# Patient Record
Sex: Male | Born: 1980
Health system: Southern US, Community
[De-identification: ages and names within clinical notes are randomized; demographics above are authoritative.]

## PROBLEM LIST (undated history)

## (undated) DIAGNOSIS — S62102A Fracture of unspecified carpal bone, left wrist, initial encounter for closed fracture: Secondary | ICD-10-CM

## (undated) DIAGNOSIS — K219 Gastro-esophageal reflux disease without esophagitis: Secondary | ICD-10-CM

## (undated) DIAGNOSIS — J189 Pneumonia, unspecified organism: Secondary | ICD-10-CM

## (undated) DIAGNOSIS — D649 Anemia, unspecified: Secondary | ICD-10-CM

## (undated) HISTORY — PX: OTHER SURGICAL HISTORY: SHX169

## (undated) HISTORY — PX: NO PAST SURGERIES: SHX2092

---
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
## 2009-02-17 ENCOUNTER — Emergency Department (HOSPITAL_COMMUNITY): Admission: EM | Admit: 2009-02-17 | Discharge: 2009-02-17 | Payer: Self-pay | Admitting: Emergency Medicine

## 2009-02-28 ENCOUNTER — Emergency Department (HOSPITAL_COMMUNITY): Admission: EM | Admit: 2009-02-28 | Discharge: 2009-02-28 | Payer: Self-pay | Admitting: Emergency Medicine

## 2012-02-23 ENCOUNTER — Emergency Department (HOSPITAL_COMMUNITY)
Admission: EM | Admit: 2012-02-23 | Discharge: 2012-02-24 | Disposition: A | Discharge status: Home / Self Care | Payer: Self-pay | Attending: Emergency Medicine | Admitting: Emergency Medicine

## 2012-02-23 ENCOUNTER — Encounter (HOSPITAL_COMMUNITY): Payer: Self-pay | Admitting: *Deleted

## 2012-02-23 DIAGNOSIS — M65839 Other synovitis and tenosynovitis, unspecified forearm: Secondary | ICD-10-CM | POA: Insufficient documentation

## 2012-02-23 DIAGNOSIS — F17200 Nicotine dependence, unspecified, uncomplicated: Secondary | ICD-10-CM | POA: Insufficient documentation

## 2012-02-23 DIAGNOSIS — R209 Unspecified disturbances of skin sensation: Secondary | ICD-10-CM | POA: Insufficient documentation

## 2012-02-23 NOTE — ED Notes (Signed)
The pt is c/o rt hand numbness and tingliong.  He had a piece of metal embedded in his rt little finger and he is afraid that another piece of metal is in his hand

## 2012-02-23 NOTE — ED Notes (Addendum)
Pt states he got metal in his right hand at work 2 days ago and thought he retrieved all remnants. Since incident he has been experiencing numbness and pain in right hand and arm. No swelling, redness or deformity noted. No lac or abrasion noted.

## 2012-02-24 MED ORDER — TETANUS-DIPHTH-ACELL PERTUSSIS 5-2.5-18.5 LF-MCG/0.5 IM SUSP
0.5000 mL | Freq: Once | INTRAMUSCULAR | Status: AC
  Administered 2012-02-24: 0.5 mL via INTRAMUSCULAR
  Filled 2012-02-24: qty 0.5

## 2012-02-24 MED ORDER — KETOROLAC TROMETHAMINE 60 MG/2ML IM SOLN
60.0000 mg | Freq: Once | INTRAMUSCULAR | Status: AC
  Administered 2012-02-24: 60 mg via INTRAMUSCULAR
  Filled 2012-02-24: qty 2

## 2012-02-24 MED ORDER — NAPROXEN 500 MG PO TABS: 500.0000 mg | ORAL_TABLET | Freq: Two times a day (BID) | ORAL | Status: DC

## 2012-02-24 MED ORDER — TRAMADOL HCL 50 MG PO TABS: 50.0000 mg | ORAL_TABLET | Freq: Four times a day (QID) | ORAL | Status: DC | PRN

## 2012-02-24 NOTE — ED Provider Notes (Signed)
Medical screening examination/treatment/procedure(s) were performed by non-physician practitioner and as supervising physician I was immediately available for consultation/collaboration.  Kennedey Digilio L Amori Colomb, MD 02/24/12 0830 

## 2012-02-24 NOTE — ED Provider Notes (Signed)
History     CSN: [id number]  Arrival date & time 02/23/12  2249   First MD Initiated Contact with Patient 02/24/12 0005      Chief Complaint  Patient presents with  . Hand Pain   HPI  History provided by the patient. Patient is a 31 year old male with no significant PMH who presents with complaints of right hand pain, throbbing with associated tingling and numbness. Symptoms have been present for the past one to 2 weeks. Patient works for a [employer] and is right-hand dominant and performs many repetitive actions and movements with his hands. Patient also states that he occasionally is prone to having small pieces of metal hit his hands and fingers while performing grinding or cutting. Patient denies having any specific injury prior to symptoms. He complains of pain primarily located on the palmar surface over the second metacarpal area. He denies having any swelling or redness of the skin. Patient states pain and throbbing causes a tingling and numbness to all of his fingertips. Pain is worse with some movements especially grabbing and holding objects. He denies any significant pain or discomfort in the wrist.    History reviewed. No pertinent past medical history.  History reviewed. No pertinent past surgical history.  No family history on file.  History  Substance Use Topics  . Smoking status: Current Every Day Smoker  . Smokeless tobacco: Not on file  . Alcohol Use: No      Review of Systems  Constitutional: Negative for fever and chills.  Musculoskeletal:       Right hand pain  Neurological: Positive for numbness. Negative for weakness.  All other systems reviewed and are negative.    Allergies  Review of patient's allergies indicates no known allergies.  Home Medications  No current outpatient prescriptions on file.  BP 110/59  Pulse 62  Temp 97.8 F (36.6 C) (Oral)  Resp 18  SpO2 97%  Physical Exam  Nursing note and vitals  reviewed. Constitutional: He is oriented to person, place, and time. He appears well-developed and well-nourished. No distress.  HENT:  Head: Normocephalic.  Cardiovascular: Normal rate and regular rhythm.   Pulmonary/Chest: Effort normal and breath sounds normal.  Musculoskeletal: Normal range of motion. He exhibits tenderness. He exhibits no edema.       Normal range of motion of fingers and wrist of right hand. Normal grip strength. There is some pain with movements and local tenderness over the palmar surface. No gross deformities. No swelling or erythema of the skin. No lesions or abrasions of the skin. Normal distal sensation of the fingers to light and sharp touch. Normal cap refill less than 2 seconds. Normal radial pulses. Negative Tinel's sign and Phalen's sign. Negative Finkelstein test. No pain or tenderness in the forearm. No swelling.  Neurological: He is alert and oriented to person, place, and time.  Skin: Skin is warm. No rash noted. No erythema.  Psychiatric: He has a normal mood and affect. His behavior is normal.    ED Course  Procedures     1. Tendinitis of hand       MDM  12:15 AM patient seen and evaluated. Patient well-appearing in no acute distress. No significant swelling or skin changes of the hand. No lacerations, abrasions or other wounds.  No signs concerning for infection. 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 Symptoms may represent a tendinitis or overuse injury. No signs for carpal tunnel syndrome.  Angus Seller, [place] 02/24/12 [phone]

## 2012-02-24 NOTE — Progress Notes (Signed)
Orthopedic Tech Progress Note Patient Details:  Ashby Kapala Sep 15, 1980 [id number]  Ortho Devices Type of Ortho Device: Velcro wrist splint   Haskell Flirt 02/24/2012, 12:59 AM

## 2013-02-19 ENCOUNTER — Observation Stay (HOSPITAL_COMMUNITY)
Admission: EM | Admit: 2013-02-19 | Discharge: 2013-02-20 | Disposition: A | Discharge status: Home / Self Care | Payer: Self-pay | Attending: Internal Medicine | Admitting: Internal Medicine

## 2013-02-19 ENCOUNTER — Encounter (HOSPITAL_COMMUNITY): Payer: Self-pay | Admitting: Emergency Medicine

## 2013-02-19 DIAGNOSIS — N179 Acute kidney failure, unspecified: Secondary | ICD-10-CM

## 2013-02-19 DIAGNOSIS — Z23 Encounter for immunization: Secondary | ICD-10-CM | POA: Insufficient documentation

## 2013-02-19 DIAGNOSIS — K649 Unspecified hemorrhoids: Secondary | ICD-10-CM

## 2013-02-19 DIAGNOSIS — I498 Other specified cardiac arrhythmias: Secondary | ICD-10-CM | POA: Insufficient documentation

## 2013-02-19 DIAGNOSIS — R1013 Epigastric pain: Secondary | ICD-10-CM

## 2013-02-19 DIAGNOSIS — R5381 Other malaise: Secondary | ICD-10-CM | POA: Insufficient documentation

## 2013-02-19 DIAGNOSIS — K922 Gastrointestinal hemorrhage, unspecified: Secondary | ICD-10-CM

## 2013-02-19 DIAGNOSIS — K6289 Other specified diseases of anus and rectum: Secondary | ICD-10-CM | POA: Insufficient documentation

## 2013-02-19 DIAGNOSIS — K625 Hemorrhage of anus and rectum: Principal | ICD-10-CM

## 2013-02-19 DIAGNOSIS — R109 Unspecified abdominal pain: Secondary | ICD-10-CM | POA: Insufficient documentation

## 2013-02-19 DIAGNOSIS — R42 Dizziness and giddiness: Secondary | ICD-10-CM | POA: Insufficient documentation

## 2013-02-19 DIAGNOSIS — I951 Orthostatic hypotension: Secondary | ICD-10-CM

## 2013-02-19 DIAGNOSIS — N289 Disorder of kidney and ureter, unspecified: Secondary | ICD-10-CM

## 2013-02-19 LAB — CBC WITH DIFFERENTIAL/PLATELET
Basophils Relative: 0 % (ref 0–1)
Eosinophils Absolute: 0.2 10*3/uL (ref 0.0–0.7)
Eosinophils Relative: 3 % (ref 0–5)
Lymphs Abs: 1.9 10*3/uL (ref 0.7–4.0)
MCHC: 33.9 g/dL (ref 30.0–36.0)
MCV: 82.5 fL (ref 78.0–100.0)
Monocytes Absolute: 0.7 10*3/uL (ref 0.1–1.0)
Neutrophils Relative %: 57 % (ref 43–77)
Platelets: 251 10*3/uL (ref 150–400)
RBC: 4.97 MIL/uL (ref 4.22–5.81)

## 2013-02-19 LAB — SODIUM, URINE, RANDOM: Sodium, Ur: 239 mEq/L

## 2013-02-19 LAB — HEPATIC FUNCTION PANEL
ALT: 33 U/L (ref 0–53)
Albumin: 3.6 g/dL (ref 3.5–5.2)
Bilirubin, Direct: <0.1 mg/dL (ref 0.0–0.3)
Total Bilirubin: 0.4 mg/dL (ref 0.3–1.2)

## 2013-02-19 LAB — BASIC METABOLIC PANEL
BUN: 12 mg/dL (ref 6–23)
CO2: 26 mEq/L (ref 19–32)
Calcium: 8.8 mg/dL (ref 8.4–10.5)
Chloride: 103 mEq/L (ref 96–112)
Chloride: 105 mEq/L (ref 96–112)
GFR calc Af Amer: 73 mL/min — ABNORMAL LOW (ref >90)
GFR calc non Af Amer: 61 mL/min — ABNORMAL LOW (ref >90)
Glucose, Bld: 108 mg/dL — ABNORMAL HIGH (ref 70–99)
Glucose, Bld: 90 mg/dL (ref 70–99)
Potassium: 4 mEq/L (ref 3.5–5.1)
Sodium: 139 mEq/L (ref 135–145)

## 2013-02-19 LAB — LIPASE, BLOOD: Lipase: 95 U/L — ABNORMAL HIGH (ref 11–59)

## 2013-02-19 LAB — LACTIC ACID, PLASMA: Lactic Acid, Venous: 1.2 mmol/L (ref 0.5–2.2)

## 2013-02-19 LAB — RAPID URINE DRUG SCREEN, HOSP PERFORMED
Amphetamines: NOT DETECTED
Benzodiazepines: NOT DETECTED

## 2013-02-19 LAB — CREATININE, URINE, RANDOM: Creatinine, Urine: 215.39 mg/dL

## 2013-02-19 LAB — TROPONIN I: Troponin I: <0.3 ng/mL (ref <0.30)

## 2013-02-19 MED ORDER — SODIUM CHLORIDE 0.9 % IJ SOLN: 3.0000 mL | INTRAMUSCULAR | Status: DC | PRN

## 2013-02-19 MED ORDER — SODIUM CHLORIDE 0.9 % IV BOLUS (SEPSIS)
1000.0000 mL | Freq: Once | INTRAVENOUS | Status: AC
  Administered 2013-02-19: 1000 mL via INTRAVENOUS

## 2013-02-19 MED ORDER — MORPHINE SULFATE 4 MG/ML IJ SOLN
4.0000 mg | Freq: Once | INTRAMUSCULAR | Status: AC
  Administered 2013-02-19: 4 mg via INTRAVENOUS
  Filled 2013-02-19: qty 1

## 2013-02-19 MED ORDER — INFLUENZA VAC SPLIT QUAD 0.5 ML IM SUSP
0.5000 mL | INTRAMUSCULAR | Status: AC
  Administered 2013-02-20: 0.5 mL via INTRAMUSCULAR
  Filled 2013-02-19: qty 0.5

## 2013-02-19 MED ORDER — SODIUM CHLORIDE 0.9 % IV SOLN: 250.0000 mL | INTRAVENOUS | Status: DC | PRN

## 2013-02-19 MED ORDER — PNEUMOCOCCAL VAC POLYVALENT 25 MCG/0.5ML IJ INJ
0.5000 mL | INJECTION | INTRAMUSCULAR | Status: AC
  Administered 2013-02-20: 0.5 mL via INTRAMUSCULAR
  Filled 2013-02-19: qty 0.5

## 2013-02-19 MED ORDER — SODIUM CHLORIDE 0.9 % IJ SOLN: 3.0000 mL | Freq: Two times a day (BID) | INTRAMUSCULAR | Status: DC

## 2013-02-19 MED ORDER — SODIUM CHLORIDE 0.9 % IV SOLN
INTRAVENOUS | Status: DC
  Administered 2013-02-19 (×2): via INTRAVENOUS

## 2013-02-19 MED ORDER — ACETAMINOPHEN 325 MG PO TABS
650.0000 mg | ORAL_TABLET | Freq: Four times a day (QID) | ORAL | Status: DC | PRN
  Administered 2013-02-19: 650 mg via ORAL
  Filled 2013-02-19: qty 2

## 2013-02-19 NOTE — ED Provider Notes (Signed)
CSN: [id number]     Arrival date & time 02/19/13  1302 History   First MD Initiated Contact with Patient 02/19/13 1316     Chief Complaint  Patient presents with  . GI Bleeding   (Consider location/radiation/quality/duration/timing/severity/associated sxs/prior Treatment) HPI  A generally healthy 32 year old male presents complaining of rectal bleeding. Patient states he has noticed dark red blood in his stools ongoing for the past 1 week. Report having low abnormal pain and rectal pain with associate GI bleeding. Blood is quantify as the third of a cup, as it filled his tissue paper and dripped to the toilet bowl.  Abdominal pain has been of about 2 days, persistent, nothing makes it better or worse. Pain is located to his low abdomen. No specific treatment tried. Denies having fever, chills, lightheadedness, dizziness, chest pain, shortness of breath, back pain, dysuria, hematuria, melena. Denies taking any NSAIDs on a regular basis. Denies history of alcohol abuse. Does not take any blood thinner medication. Denies any recent trauma. Patient usually has 2-3 bowel movement per day, and that has been normal.  History reviewed. No pertinent past medical history. History reviewed. No pertinent past surgical history. History reviewed. No pertinent family history. History  Substance Use Topics  . Smoking status: Current Every Day Smoker  . Smokeless tobacco: Not on file  . Alcohol Use: No    Review of Systems  All other systems reviewed and are negative.    Allergies  Review of patient's allergies indicates no known allergies.  Home Medications   Current Outpatient Rx  Name  Route  Sig  Dispense  Refill  . naproxen (NAPROSYN) 500 MG tablet   Oral   Take 1 tablet (500 mg total) by mouth 2 (two) times daily.   30 tablet   0   . traMADol (ULTRAM) 50 MG tablet   Oral   Take 1 tablet (50 mg total) by mouth every 6 (six) hours as needed for pain.   15 tablet   0    BP 113/64   Pulse 67  Temp(Src) 98.1 F (36.7 C) (Oral)  Resp 16  Wt 288 lb 3.2 oz (130.727 kg)  SpO2 97% Physical Exam  Nursing note and vitals reviewed. Constitutional: He is oriented to person, place, and time.  Awake, alert, nontoxic appearance  HENT:  Head: Atraumatic.  Mouth/Throat: Oropharynx is clear and moist.  Eyes: Right eye exhibits no discharge. Left eye exhibits no discharge.  Neck: Neck supple.  Pulmonary/Chest: Effort normal. He exhibits no tenderness.  Abdominal: Soft. Bowel sounds are normal. There is tenderness (Suprapubic and left lower quadrant tenderness on palpation without guarding or rebound tenderness. No hernia noted.). There is no rebound and no guarding.  Genitourinary:  Difficult to perform digital rectal exam as it causes great discomfort to patient.  external hemorrhoid noted without evidence of thrombosis. Bright red blood per rectum without obvious anal fissure or mass. Unable to assess stool color.  Musculoskeletal: He exhibits no tenderness.  Baseline ROM, no obvious new focal weakness  Neurological: He is alert and oriented to person, place, and time.  Mental status and motor strength appears baseline for patient and situation  Skin: No rash noted.  Psychiatric: He has a normal mood and affect.    ED Course  Procedures (including critical care time)  Pt with BRBPR, new onset for the past 1 week.  Has lower abdominal pain as well.  DDx includes colitis, diverticulitis, hemorrhoids, IBD, anal fissure, neoplasm, AVM, ischemic bowel disease.  Work up initiated. Care discussed with attending.    2:47 PM BRBPR likely 2/2 to hemorrhoids.  Pt is in no acute distress, abdomen mildly tender without peritoneal sign.  Pt is hemodynamically stable.  Low suspicion for colitis, diverticulitis, IBD or neoplasm.   3:40 PM Pt has low BP with systolic in the 90s, and elevated HR from 48 to 80 on orthostatic VS .  Continues to endorse low abdominal pain, not improved with  morphine.  Does have some evidence of renal insufficiency with Cr. 1.45 without baseline for comparison.  With recurrent rectal bleeding and pt being symptomatic, plan to have pt admitted for obs for further care.    4:19 PM I have consulted with teaching service resident, who agrees to see pt in ER and admit for obs.  Labs Review Labs Reviewed  BASIC METABOLIC PANEL - Abnormal; Notable for the following:    Glucose, Bld 108 (*)    Creatinine, Ser 1.45 (*)    GFR calc non Af Amer 63 (*)    GFR calc Af Amer 73 (*)    All other components within normal limits  CBC WITH DIFFERENTIAL  HEPATIC FUNCTION PANEL   Imaging Review No results found.  EKG Interpretation   None       MDM   1. BRBPR (bright red blood per rectum)   2. Renal insufficiency    BP 99/62  Pulse 80  Temp(Src) 98.1 F (36.7 C) (Oral)  Resp 16  Wt 288 lb 3.2 oz (130.727 kg)  SpO2 93%  I have reviewed nursing notes and vital signs.  I reviewed available ER/hospitalization records thought the EMR     Fayrene Helper, [place] 02/19/13 1620

## 2013-02-19 NOTE — ED Notes (Signed)
PA at bedside, made aware of pt blood pressures. Order for morphine medication verified with PA.

## 2013-02-19 NOTE — H&P (Signed)
Date: 02/19/2013               Patient Name:  Dylan Brooks MRN: [id number]  DOB: 1980-04-29 Age / Sex: 32 y.o., male   PCP: Provider Default, MD         Medical Service: Internal Medicine Teaching Service         Attending Physician: Dr. Kem Kays    First Contact: Dr. Yetta Barre Pager: [phone]  Second Contact: Dr. Shirlee Latch Pager: [phone]       After Hours (After 5p/  First Contact Pager: [phone]  weekends / holidays): Second Contact Pager: [phone]   Chief Complaint: BRBPR, rectal pain, abdominal pain.   History of Present Illness:  [name] is a 32 y.o. y/o male w/ no known PMHx presents to the ED w/ complaints of bright red blood per rectum and abdominal pain for the last 3-4 days. Patient claims that he has about 3 bowel movements a day, soft, and w/ blood mixed in with the stool, in the toilet bowl, and on the toilet paper when he wipes. He says he has never had this problem before. The patient also admits to rectal pain with bowel movements, epigastric pain, lightheadedness when standing, and recent fatigue. On arrival to the ED, patient was found to have orthostatic vitals w/ change in pulse from 50 lying to 80 standing. The patient was given 1L bolus of NS and orthostatics were repeated, showing orthostatic change in pulse from 49 lying to 78 sitting.  BMET was also drawn in ED, shown to have Cr of 1.45. After fluid bolus, was still 1.49.  Otherwise, the patient denies any further issues. No recent fever or chills, no nausea, vomiting, hematemesis, constipation, diarrhea, or sick contacts. No SOB, or chest pain. He claims he has not eaten anything strange recently that made him feel sick and no one else in his family has had similar issues. No dysuria, hematuria, urgency or increased frequency of urination.  The patient did say that his mother has some type of cancer, but is unsure of the type, but does not suspect it is colon cancer.   Meds: Current Facility-Administered  Medications  Medication Dose Route Frequency Provider Last Rate Last Dose  . 0.9 %  sodium chloride infusion   Intravenous Continuous Fayrene Helper, PA-C 125 mL/hr at 02/19/13 1719     No current outpatient prescriptions on file.   Allergies: Allergies as of 02/19/2013  . (No Known Allergies)   No surgical history  No past medical history Mother has history of cancer; type unknown.  History   Social History  . Marital Status: Married    Spouse Name: N/A    Number of Children: N/A  . Years of Education: N/A   Occupational History  . Not on file.   Social History Main Topics  . Smoking status: Current Every Day Smoker  . Smokeless tobacco: Not on file  . Alcohol Use: No  . Drug Use:   . Sexual Activity:    Other Topics Concern  . Not on file   Social History Narrative  . No narrative on file    Review of Systems: General: Positive for fatigue. Denies fever, chills, diaphoresis, appetite change.  Respiratory: Denies SOB, DOE, cough, chest tightness, and wheezing.   Cardiovascular: Denies chest pain, palpitations and leg swelling.  Gastrointestinal: Positive for blood in stools, abdominal pain, rectal pain w/ BM. Denies nausea, vomiting, diarrhea, constipation,  and abdominal distention.  Genitourinary: Denies dysuria, urgency, frequency,  hematuria, flank pain and difficulty urinating.  Musculoskeletal: Denies myalgias, back pain, joint swelling, arthralgias and gait problem.  Skin: Denies pallor, rash and wounds.  Neurological: Positive for weakness, and mild lightheadness on standing. Denies dizziness, seizures, syncope,  numbness and headaches.  Psychiatric/Behavioral: Denies mood changes, confusion, nervousness, sleep disturbance and agitation.  Physical Exam: Filed Vitals:   02/19/13 1650 02/19/13 1652 02/19/13 1653 02/19/13 1715  BP: 103/62 107/60 102/58 116/54  Pulse: 47 78 71 54  Temp:      TempSrc:      Resp:  16 16   Weight:      SpO2: 96% 97% 96% 96%    General: Vital signs reviewed.  Patient is a well-developed and well-nourished, in no acute distress and cooperative with exam. Alert and oriented x3.  Head: Normocephalic and atraumatic. Eyes: PERRL, EOMI, conjunctivae normal, No scleral icterus.  Neck: Supple, trachea midline, normal ROM, No JVD, masses, thyromegaly, or carotid bruit present.  Cardiovascular: RRR, S1 normal, S2 normal, no murmurs, gallops, or rubs. Pulmonary/Chest: Normal respiratory effort, CTAB, no wheezes, rales, or rhonchi. Abdominal: Soft, mildly tender in the epigastrium, non-distended, bowel sounds are normal, no masses, organomegaly, or guarding present. Tenderness on rectal exam. Internal and external hemorrhoids present.  Musculoskeletal: No joint deformities, erythema, or stiffness, ROM full and no nontender. Extremities: No swelling or edema,  pulses symmetric and intact bilaterally. No cyanosis or clubbing. Neurological: A&O x3, Strength is normal and symmetric bilaterally, cranial nerve II-XII are grossly intact, no focal motor deficit, sensory intact to light touch bilaterally.  Skin: Warm, dry and intact. No rashes or erythema. Multiple tattoos, face, abdomen.  Psychiatric: Normal mood and affect. speech and behavior is normal. Cognition and memory are normal.   Lab results: Basic Metabolic Panel:  Recent Labs  78/29/56 1424 02/19/13 1648  NA 139 139  K 3.7 4.0  CL 103 105  CO2 26 26  GLUCOSE 108* 90  BUN 12 12  CREATININE 1.45* 1.49*  CALCIUM 9.2 8.8   Liver Function Tests:  Recent Labs  02/19/13 1424  AST 27  ALT 33  ALKPHOS 49  BILITOT 0.4  PROT 6.8  ALBUMIN 3.6   CBC:  Recent Labs  02/19/13 1424  WBC 6.4  NEUTROABS 3.7  HGB 13.9  HCT 41.0  MCV 82.5  PLT 251   Other results: EKG: Sinus @ 48 bpm, w/ possible diffuse ST elevation, suggestive of pericarditis  Assessment & Plan by Problem: [name] is a 32 y.o. y/o male w/ no known PMHx presents to the ED w/  complaints of bright red blood per rectum and abdominal pain for the last 3-4 days. Shown to be orthostatic in the ED, even after 1L bolus. EKG also showed possible diffuse ST elevation, suggestive of pericarditis.  BRBPR- Claims he has had these symptoms for the past 3-4 days, w/ associated epigastric pain, rectal pain, lightheadedness, and fatigue. He says he has never has this issue before and has blood in his stool with every bowel movement lately, and claims the amount to be quite significant. The patient claims he has not felt like himself recently. On admission, found to have normal Hb of 13.9, however, his baseline is unknown. He also admits to decreased po intake recently as he has not felt well lately. In the ED, was found to have orthostatic vitals w/ pulse change of 50 lying to 80 standing. He was given 1L bolus of fluids and orthostatics vitals still showed significant change in  pulse (>20). At this point, findings are most likely 2/2 hemorrhoidal bleed as he describes rectal pain as well. ON rectal exam, patient was noted to have small (1 cm) external hemorrhoid as well as internal hemorrhoids. Tenderness was noted on examination. Other possibilities include diverticulosis, however this is less likely given the patient's age. Colitis is also a possibility, but abdominal pain on palpation is very mild in nature. He also describes a family history of cancer in his mother (87 y/o), but does not admit to recent weight loss, night sweats, fever, chills, or severe fatigue (only mild fatigue). The patient also describes epigastric pain, which may be due to gastritis, however, this would not explain his BRBPR at this time. Must laso rule out bowel ischemia as well, however, given normal HCO3 and absence of anion gap, this is very unlikely. Will also consider pancreatitis. -Admit to telemtry for observation (see below).  -Will give another 1L NS bolus.  -Repeat CBC + PT/INR in AM.  -Consider colonoscopy  as an outpatient given unknown family history of cancer.  -HIV pending -Clear liquid diet for now.  -EKG pending -Lactic acid, lipase pending  AKI- Patient was also noted to have Cr of 1.45 on admission. After 1L bolus, BMET was repeated, found to be 1.49. Baseline for this patient is unknown. AKI probably d/t pre-renal etiology as he describes decreased po intake lately and likely is volume depleted at this time given his +ve orthostatic changes. Denies any recent antibiotic use, no recent contrast imaging, and claims to only have NSAIDS very infrequently, but does claim he had 1-2 in the past couple of days. UDS +ve for opiates and cocaine, possibly contributing to AKI.  Denies any urinary symptoms, therefore, post-renal etiology is very unlikely.  -Collect Urine creatinine and sodium to calculate FeNa. -Continue IVF for now, giving another 1L bolus at this time. Will then start NS @ 125 ml/hr.  -Repeat BMET in the AM.  -UDS +ve for opiates, cocaine, and THC. Blood alcohol pending. -UA pending to look for granular casts, protein, crystals, ketones.   Possible Pericarditis- Patient w/ no complaints of chest pain, but epigastric pain. EKG performed in the ED, showed possible diffuse ST elevation, suggestive of pericarditis. Patient denies recent URI symptoms, pain w/ lying on back, and no recent SOB. No tachycardia on exam, no pericardial friction rub, no distant heart sounds, no JVD. It is possible that patient has viral infection that could explain these findings, as well as GI findings and AKI. Given +ve UDS for cocaine, this could also be responsible for findings as well. -Spoke w/ Dr. Tresa Endo, cardiologist, felt that EKG changes were likely J-point elevation in a young African-American male. However, felt that ECHO in the AM would be reasonable test. Does not feel that changes are significant for ACS.  -Repeat EKG in AM -Troponin x1 pending -2v CXR pending -Consider ECHO in AM. -Will withhold  treatment at this time as NSAIDS could make other issue worse at this time.   DVT/PE PPx: SCD's  Dispo: Disposition is deferred at this time, awaiting improvement of current medical problems. Anticipated discharge in approximately 1-2 day(s).   The patient does not have a current PCP (Provider Default, MD) and does need an [facility] hospital follow-up appointment after discharge.  The patient does not have transportation limitations that hinder transportation to clinic appointments.  Signed: Courtney Paris, MD 02/19/2013, 5:37 PM  Pager: [phone]

## 2013-02-19 NOTE — ED Notes (Signed)
NOTIFIED PHYSICIAN OF LAB RESULTS OF OCCULT BLOOD STOOL = ( + ) POSITIVE, @15 :30PM, 02/19/2013.

## 2013-02-19 NOTE — ED Notes (Signed)
Reports dark red blood in stools x 1 week. Reports generalized abd pain, no vomiting.

## 2013-02-19 NOTE — Progress Notes (Signed)
Received report from [place], Charity fundraiser.  Kathlene November, [place]

## 2013-02-19 NOTE — ED Notes (Signed)
Paged admitting MD to notify EKG has been completed. Dr. Dierdre Searles made aware pf EKG and for her to review in EPIC.

## 2013-02-20 ENCOUNTER — Ambulatory Visit: Payer: Self-pay | Admitting: Internal Medicine

## 2013-02-20 DIAGNOSIS — N179 Acute kidney failure, unspecified: Secondary | ICD-10-CM

## 2013-02-20 DIAGNOSIS — K649 Unspecified hemorrhoids: Secondary | ICD-10-CM | POA: Diagnosis present

## 2013-02-20 DIAGNOSIS — F1910 Other psychoactive substance abuse, uncomplicated: Secondary | ICD-10-CM

## 2013-02-20 DIAGNOSIS — K625 Hemorrhage of anus and rectum: Secondary | ICD-10-CM | POA: Diagnosis present

## 2013-02-20 DIAGNOSIS — R1013 Epigastric pain: Secondary | ICD-10-CM | POA: Diagnosis present

## 2013-02-20 LAB — RPR: RPR Ser Ql: NONREACTIVE

## 2013-02-20 LAB — BASIC METABOLIC PANEL
CO2: 26 mEq/L (ref 19–32)
Calcium: 8.7 mg/dL (ref 8.4–10.5)
Glucose, Bld: 90 mg/dL (ref 70–99)
Potassium: 3.6 mEq/L (ref 3.5–5.1)
Sodium: 138 mEq/L (ref 135–145)

## 2013-02-20 LAB — CBC
HCT: 39.1 % (ref 39.0–52.0)
Hemoglobin: 13.1 g/dL (ref 13.0–17.0)
MCH: 27.8 pg (ref 26.0–34.0)
MCV: 82.8 fL (ref 78.0–100.0)
RBC: 4.72 MIL/uL (ref 4.22–5.81)
WBC: 8.6 10*3/uL (ref 4.0–10.5)

## 2013-02-20 LAB — TSH: TSH: 2.217 u[IU]/mL (ref 0.350–4.500)

## 2013-02-20 LAB — TROPONIN I: Troponin I: <0.3 ng/mL (ref <0.30)

## 2013-02-20 MED ORDER — NICOTINE 21 MG/24HR TD PT24
21.0000 mg | MEDICATED_PATCH | Freq: Every day | TRANSDERMAL | Status: DC
  Administered 2013-02-20: 21 mg via TRANSDERMAL
  Filled 2013-02-20: qty 1

## 2013-02-20 NOTE — Discharge Summary (Signed)
Name: Dylan Brooks MRN: [id number] DOB: [birth year] 32 y.o. PCP: Jeanann Lewandowsky, MD  Date of Admission: 02/19/2013  1:13 PM Date of Discharge: 02/20/13 Attending Physician: Kem Kays  Discharge Diagnosis: 1. BRBPR 2. AKI 3. Possible Pericarditis 4. Substance abuse  Discharge Medications:   Medication List    Notice   You have not been prescribed any medications.      Disposition and follow-up:   Mr.Dylan Brooks was discharged from [facility] in good condition.  At the hospital follow up visit please address:  1.  Any further BRBPR? Dizziness, lightheadedness, fatigue, rectal pain? Chest pain?  2.  Labs / imaging needed at time of follow-up: BMET, CBC  3.  Pending labs/ test needing follow-up: none  Follow-up Appointments: Follow-up Information   Follow up with [facility]     On 02/25/2013. (3:15 PM)    Contact information:   [address] [place] [address] [phone]      Discharge Instructions: Discharge Orders   Future Appointments Provider Department Dept Phone   04/27/2013 9:00 AM [facility] [phone]   04/30/2013 10:00 AM Doris Cheadle, MD [facility] [phone]   Future Orders Complete By Expires   Call MD for:  persistant dizziness or light-headedness  As directed    Call MD for:  persistant nausea and vomiting  As directed      Admission HPI:  [name] is a 32 y.o. y/o male w/ no known PMHx presents to the ED w/ complaints of bright red blood per rectum and abdominal pain for the last 3-4 days. Patient claims that he has about 3 bowel movements a day, soft, and w/ blood mixed in with the stool, in the toilet bowl, and on the toilet paper when he wipes. He says he has never had this problem before. The patient also admits to rectal pain with bowel movements, epigastric pain, lightheadedness when standing, and  recent fatigue. On arrival to the ED, patient was found to have orthostatic vitals w/ change in pulse from 50 lying to 80 standing. The patient was given 1L bolus of NS and orthostatics were repeated, showing orthostatic change in pulse from 49 lying to 78 sitting.  BMET was also drawn in ED, shown to have Cr of 1.45. After fluid bolus, was still 1.49.  Otherwise, the patient denies any further issues. No recent fever or chills, no nausea, vomiting, hematemesis, constipation, diarrhea, or sick contacts. No SOB, or chest pain. He claims he has not eaten anything strange recently that made him feel sick and no one else in his family has had similar issues. No dysuria, hematuria, urgency or increased frequency of urination.  The patient did say that his mother has some type of cancer, but is unsure of the type, but does not suspect it is colon cancer.   Hospital Course by problem list:   1. BRBPR- Claims he has had these symptoms for the past 3-4 days, w/ associated epigastric pain, rectal pain, lightheadedness, and fatigue. He says he has never has this issue before and has blood in his stool with every bowel movement lately, and claims the amount to be significant. On admission, found to have normal Hb of 13.9, however, his baseline is unknown. In the ED, was found to have orthostatic vitals w/ pulse change of 50 lying to 80 standing. He was given 1L bolus of fluids and orthostatics vitals  still showed significant change in pulse (>20). Findings most likely 2/2 hemorrhoidal bleed as he describes rectal pain as well. On rectal exam, patient was noted to have small (1 cm) external hemorrhoid as well as internal hemorrhoids. Tenderness was noted on examination. Given another 1L bolus. Lactic acid, lipase found to be normal. UDS +ve for cocaine, opiates, and THC. HIV -ve. CBC trend as follows:  Recent Labs Lab 02/19/13 1424 02/20/13 0100  HGB 13.9 13.1  HCT 41.0 39.1  WBC 6.4 8.6  PLT 251 245    AKI-  Patient was also noted to have Cr of 1.45 on admission. After 1L bolus, BMET was repeated, found to be 1.49. Baseline for this patient is unknown. AKI probably d/t pre-renal etiology as he describes decreased po intake lately and likely is volume depleted at this time given his +ve orthostatic changes. Denies any recent antibiotic use, no recent contrast imaging, and claims to only have NSAIDS very infrequently, but does claim he had 1-2 in the past couple of days. UDS +ve for opiates and cocaine, possibly contributing to AKI. Continued IVF. Repeat Cr as follows:  Recent Labs Lab 02/19/13 1424 02/19/13 1648 02/20/13 0100  CREATININE 1.45* 1.49* 1.48*  Likely that patient has a baseline of 1.4-1.5, given a possible history of substance abuse as well as high creatinine due to larger muscle mass.  Possible Pericarditis- Patient w/ no complaints of chest pain, but epigastric pain. EKG performed in the ED, showed possible diffuse ST elevation, suggestive of pericarditis. Patient denies recent URI symptoms, pain w/ lying on back, and no recent SOB. No tachycardia on exam, no pericardial friction rub, no distant heart sounds, no JVD. Spoke w/ Dr. Tresa Endo, cardiologist, felt that EKG changes were likely J-point elevation in a young African-American male. Troponin x1 -ve, EKG repeated in AM, no change from previous EKG. No further workup.  Substance abuse- Patient asked about drug and alcohol abuse in ED, patient denied. UDS +ve for THC, cocaine, and opiates. Patient continued to deny, social work consulted.  Discharge Vitals:   BP 110/77  Pulse 81  Temp(Src) 98.1 F (36.7 C) (Oral)  Resp 23  Ht 5\' 11"  (1.803 m)  Wt 283 lb 1.6 oz (128.413 kg)  BMI 39.50 kg/m2  SpO2 99%  Discharge Labs:  No results found for this or any previous visit (from the past 24 hour(s)).  Signed: Courtney Paris, MD 02/25/2013, 6:59 PM   Time Spent on Discharge: 35 minutes Services Ordered on Discharge: none Equipment  Ordered on Discharge: none

## 2013-02-20 NOTE — Progress Notes (Signed)
Subjective: [name] is a 32 y.o. y/o male w/ no known PMHx presents to the ED w/ complaints of bright red blood per rectum and abdominal pain for the last 3-4 days. Shown to be orthostatic in the ED, even after 1L bolus.   Patient seen at bedside this AM. Says he feels fine, is ready to leave. Denies any recent abdominal pain, N/V. Still had minimal blood per rectum today.  Objective: Vital signs in last 24 hours: Filed Vitals:   02/20/13 0600 02/20/13 0650 02/20/13 0653 02/20/13 0656  BP: 107/51 107/51 106/71 129/81  Pulse: 43 43 47 73  Temp:      TempSrc:      Resp:      Height:      Weight:      SpO2:       Weight change:   Intake/Output Summary (Last 24 hours) at 02/20/13 0830 Last data filed at 02/20/13 0600  Gross per 24 hour  Intake      0 ml  Output    625 ml  Net   -625 ml   Physical Exam: General: Alert, cooperative, and in no apparent distress HEENT: Vision grossly intact, oropharynx clear and non-erythematous  Neck: Full range of motion without pain, supple, no lymphadenopathy or carotid bruits Lungs: Clear to ascultation bilaterally, normal work of respiration, no wheezes, rales, ronchi Heart: Regular rate and rhythm, no murmurs, gallops, or rubs Abdomen: Soft, non-tender, non-distended, normal bowel sounds Extremities: No cyanosis, clubbing, or edema Neurologic: Alert & oriented X3, cranial nerves II-XII intact, strength grossly intact, sensation intact to light touch  Lab Results: Basic Metabolic Panel:  Recent Labs Lab 02/19/13 1648 02/19/13 1958 02/20/13 0100  NA 139  --  138  K 4.0  --  3.6  CL 105  --  105  CO2 26  --  26  GLUCOSE 90  --  90  BUN 12  --  12  CREATININE 1.49*  --  1.48*  CALCIUM 8.8  --  8.7  MG  --  1.9  --    Liver Function Tests:  Recent Labs Lab 02/19/13 1424  AST 27  ALT 33  ALKPHOS 49  BILITOT 0.4  PROT 6.8  ALBUMIN 3.6    Recent Labs Lab 02/19/13 1958  LIPASE 95*   No results found for  this basename: AMMONIA,  in the last 168 hours CBC:  Recent Labs Lab 02/19/13 1424 02/20/13 0100  WBC 6.4 8.6  NEUTROABS 3.7  --   HGB 13.9 13.1  HCT 41.0 39.1  MCV 82.5 82.8  PLT 251 245   Cardiac Enzymes:  Recent Labs Lab 02/19/13 1900 02/20/13 0100  TROPONINI <0.30 <0.30   Thyroid Function Tests:  Recent Labs Lab 02/19/13 1958  TSH 2.217   Coagulation:  Recent Labs Lab 02/19/13 1958  LABPROT 12.4  INR 0.94   Urine Drug Screen: Drugs of Abuse     Component Value Date/Time   LABOPIA POSITIVE* 02/19/2013 1752   COCAINSCRNUR POSITIVE* 02/19/2013 1752   LABBENZ NONE DETECTED 02/19/2013 1752   AMPHETMU NONE DETECTED 02/19/2013 1752   THCU POSITIVE* 02/19/2013 1752   LABBARB NONE DETECTED 02/19/2013 1752    Alcohol Level:  Recent Labs Lab 02/19/13 1958  ETH <11   Medications: I have reviewed the patient's current medications. Scheduled Meds: . influenza vac split quadrivalent PF  0.5 mL Intramuscular Tomorrow-1000  . pneumococcal 23 valent vaccine  0.5 mL Intramuscular Tomorrow-1000  . sodium chloride  3 mL Intravenous Q12H   Continuous Infusions: . sodium chloride 125 mL/hr at 02/19/13 2143   PRN Meds:.sodium chloride, acetaminophen, sodium chloride Assessment/Plan: [name] is a 32 y.o. y/o male w/ no known PMHx presents to the ED w/ complaints of bright red blood per rectum and abdominal pain for the last 3-4 days. Shown to be orthostatic in the ED, even after 1L bolus. EKG also showed possible diffuse ST elevation, suggestive of pericarditis.  BRBPR- Given history and presentation, likely 2/2 hemorrhoidal bleed at this time.  -Repeat Orthostatics negative this AM. -Continued on NS @ 125 ml/hr -CBC in AM showed Hb of 13.1. -Consider colonoscopy as an outpatient given unknown family history of cancer.  -HIV -ve -Lactic acid wnl, lipase slightly elevated to 95.  AKI- Patient was also noted to have Cr of 1.45 on admission. After 1L  bolus, BMET was repeated, found to be 1.49. Baseline unknown; may have baseline around 1.5 given muscular nature of patient, drug use, alcohol abuse, etc. FeNa 1.19% -Continue NS @ 125 ml/hr.  -Repeat BMET shows Cr of 1.48. -UDS +ve for opiates, cocaine, and THC. Blood alcohol -ve  Possible Pericarditis- Patient w/ no complaints of chest pain. EKG performed in the ED, showed possible diffuse ST elevation, suggestive of pericarditis. Repeat this AM shows same appearance. Patient denies recent URI symptoms, pain w/ lying on back, and no recent SOB. No tachycardia on exam, no pericardial friction rub, no distant heart sounds, no JVD. -Spoke w/ Dr. Tresa Endo, cardiologist, felt that EKG changes were likely J-point elevation in a young African-American male.   Dispo: Disposition is deferred at this time, awaiting improvement of current medical problems.  Anticipated discharge today.   The patient does not have a current PCP (Provider Default, MD) and does need an [facility] hospital follow-up appointment after discharge.  The patient does not have transportation limitations that hinder transportation to clinic appointments.  .Services Needed at time of discharge: Y = Yes, Blank = No PT:   OT:   RN:   Equipment:   Other:     LOS: 1 day   Courtney Paris, MD 02/20/2013, 8:30 AM Pager: [phone]

## 2013-02-20 NOTE — H&P (Signed)
INTERNAL MEDICINE TEACHING SERVICE Attending Admission Note  Date: 02/20/2013  Patient name: Dylan Brooks  Medical record number: [id number]  Date of birth: [birth year]    I have seen and evaluated [facility] and discussed their care with the Residency Team.  32 yr old male with no pmhx presented due to abdominal pani and intermittent BRBPR over past few days. He states he was recently constipated in past week. He states he had one very painful BM last week and since then has had some rectal pain with bleeding noted in stool as well as when wiping.   In the ED, he was found to have orthostatic hypotension and received NS bolus of 1 L. His Hgb/Hct has been stable since admission. He has not had any further bleeding. He denies any abdominal pain today. On exam: CV: S1S2, bradycardic, no m/r/g. PULM: CTA bilat ABD/GI: Soft, NT, +BS, no guarding.  Noted Cr of 1.45 on admission. I don't think he has AKI. He needs to be evaluated as outpatient for CKD. He will require 24 hour urine collection for calculation of CrCl in addition to a renal U/S and screening for HIV, Hep B, Hep C. He will need a UA. Of note, he denied drug use but urine is + for cocaine, THC, and +opiates.  At this time, he is medically stable for D/C. He is no longer orthostatic. I would have him follow up and establish with a PCP in 1-2 weeks. He agrees to this plan.  Jonah Blue, DO, FACP Faculty [facility] Internal Medicine Residency Program 02/20/2013, 1:07 PM

## 2013-02-20 NOTE — Progress Notes (Signed)
Pt discharged to home after visit summary reviewed and pt capable of re verbalizing medications and follow up appointments. Pt remains stable. No signs and symptoms of distress. Educated to return to ER in the event of SOB, dizziness, chest pain, or fainting. Tolbert Matheson, RN   

## 2013-02-20 NOTE — Progress Notes (Signed)
While ambulating pt's o2 sats were 99% on room air. Denied any sob or discomfort. Will f/u.

## 2013-02-20 NOTE — ED Provider Notes (Signed)
Medical screening examination/treatment/procedure(s) were conducted as a shared visit with non-physician practitioner(s) and myself.  I personally evaluated the patient during the encounter.  EKG Interpretation    Date/Time:    Ventricular Rate:    PR Interval:    QRS Duration:   QT Interval:    QTC Calculation:   R Axis:     Text Interpretation:              Pt c/o few episodes rectal bleeding. No hx same. No rectal trauma. No other abn bleeding or bruising.  abd soft nt. Orthostatic vitals.  Labs. Admit.   Suzi Roots, MD 02/20/13 [phone]

## 2013-02-25 ENCOUNTER — Ambulatory Visit: Payer: Self-pay | Attending: Internal Medicine | Admitting: Internal Medicine

## 2013-02-25 ENCOUNTER — Encounter: Payer: Self-pay | Admitting: Internal Medicine

## 2013-02-25 VITALS — BP 116/66 | HR 61 | Temp 99.1°F | Resp 16 | Ht 70.0 in | Wt 280.0 lb

## 2013-02-25 DIAGNOSIS — K219 Gastro-esophageal reflux disease without esophagitis: Secondary | ICD-10-CM | POA: Insufficient documentation

## 2013-02-25 DIAGNOSIS — F17200 Nicotine dependence, unspecified, uncomplicated: Secondary | ICD-10-CM

## 2013-02-25 DIAGNOSIS — Z87898 Personal history of other specified conditions: Secondary | ICD-10-CM

## 2013-02-25 DIAGNOSIS — K649 Unspecified hemorrhoids: Secondary | ICD-10-CM | POA: Insufficient documentation

## 2013-02-25 DIAGNOSIS — Z8659 Personal history of other mental and behavioral disorders: Secondary | ICD-10-CM

## 2013-02-25 DIAGNOSIS — N289 Disorder of kidney and ureter, unspecified: Secondary | ICD-10-CM

## 2013-02-25 DIAGNOSIS — Z139 Encounter for screening, unspecified: Secondary | ICD-10-CM

## 2013-02-25 MED ORDER — NICOTINE 21 MG/24HR TD PT24: 21.0000 mg | MEDICATED_PATCH | Freq: Every day | TRANSDERMAL | Status: DC

## 2013-02-25 MED ORDER — HYDROCORTISONE ACETATE 25 MG RE SUPP: 25.0000 mg | Freq: Two times a day (BID) | RECTAL | Status: DC

## 2013-02-25 MED ORDER — OMEPRAZOLE 20 MG PO CPDR: 20.0000 mg | DELAYED_RELEASE_CAPSULE | Freq: Every day | ORAL | Status: DC

## 2013-02-25 NOTE — Patient Instructions (Signed)
Hemorrhoids Hemorrhoids are swollen veins around the rectum or anus. There are two types of hemorrhoids:   Internal hemorrhoids. These occur in the veins just inside the rectum. They may poke through to the outside and become irritated and painful.  External hemorrhoids. These occur in the veins outside the anus and can be felt as a painful swelling or hard lump near the anus. CAUSES  Pregnancy.   Obesity.   Constipation or diarrhea.   Straining to have a bowel movement.   Sitting for long periods on the toilet.  Heavy lifting or other activity that caused you to strain.  Anal intercourse. SYMPTOMS   Pain.   Anal itching or irritation.   Rectal bleeding.   Fecal leakage.   Anal swelling.   One or more lumps around the anus.  DIAGNOSIS  Your caregiver may be able to diagnose hemorrhoids by visual examination. Other examinations or tests that may be performed include:   Examination of the rectal area with a gloved hand (digital rectal exam).   Examination of anal canal using a small tube (scope).   A blood test if you have lost a significant amount of blood.  A test to look inside the colon (sigmoidoscopy or colonoscopy). TREATMENT Most hemorrhoids can be treated at home. However, if symptoms do not seem to be getting better or if you have a lot of rectal bleeding, your caregiver may perform a procedure to help make the hemorrhoids get smaller or remove them completely. Possible treatments include:   Placing a rubber band at the base of the hemorrhoid to cut off the circulation (rubber band ligation).   Injecting a chemical to shrink the hemorrhoid (sclerotherapy).   Using a tool to burn the hemorrhoid (infrared light therapy).   Surgically removing the hemorrhoid (hemorrhoidectomy).   Stapling the hemorrhoid to block blood flow to the tissue (hemorrhoid stapling).  HOME CARE INSTRUCTIONS   Eat foods with fiber, such as whole grains, beans,  nuts, fruits, and vegetables. Ask your doctor about taking products with added fiber in them (fibersupplements).  Increase fluid intake. Drink enough water and fluids to keep your urine clear or pale yellow.   Exercise regularly.   Go to the bathroom when you have the urge to have a bowel movement. Do not wait.   Avoid straining to have bowel movements.   Keep the anal area dry and clean. Use wet toilet paper or moist towelettes after a bowel movement.   Medicated creams and suppositories may be used or applied as directed.   Only take over-the-counter or prescription medicines as directed by your caregiver.   Take warm sitz baths for 15 20 minutes, 3 4 times a day to ease pain and discomfort.   Place ice packs on the hemorrhoids if they are tender and swollen. Using ice packs between sitz baths may be helpful.   Put ice in a plastic bag.   Place a towel between your skin and the bag.   Leave the ice on for 15 20 minutes, 3 4 times a day.   Do not use a donut-shaped pillow or sit on the toilet for long periods. This increases blood pooling and pain.  SEEK MEDICAL CARE IF:  You have increasing pain and swelling that is not controlled by treatment or medicine.  You have uncontrolled bleeding.  You have difficulty or you are unable to have a bowel movement.  You have pain or inflammation outside the area of the hemorrhoids. MAKE SURE YOU:    Understand these instructions.  Will watch your condition.  Will get help right away if you are not doing well or get worse. Document Released: 03/16/2000 Document Revised: 03/05/2012 Document Reviewed: 01/22/2012 Exit[employer] Patient Information 2014 [employer].  

## 2013-02-25 NOTE — Discharge Summary (Signed)
  Date: 02/25/2013  Patient name: Dylan Brooks  Medical record number: [id number]  Date of birth: 1981/03/11   This patient has been seen and the plan of care was discussed with the house staff. Please see their note for complete details. I concur with their findings and plan. He will need PCP follow up. If further bleeding, he will need GI consultation as outpatient with possible flexsig or colonoscopy. Discussed this with patient. Will need outpatient evaluation of renal function, recommend 24 hour urine creatinine collection and calculation of CrCl. Suspect increase muscle mass with increased Cr, don't suspect CKD at this time.  Jonah Blue, DO, FACP Faculty [facility] Internal Medicine Residency Program 02/25/2013, 9:03 PM

## 2013-02-25 NOTE — Progress Notes (Signed)
HFU Pt was admitted to the hospital 7 days ago with abdominal pain and blood in the stool. Still no progress. Pt reports that he has had a cold for 2 months which is causing his arm to get numb and stiff making it hard to sleep.

## 2013-02-25 NOTE — Progress Notes (Signed)
Patient ID: Dylan Brooks, male   DOB: 1980/11/14, 32 y.o.   MRN: [id number] MRN: [id number] Name: Dylan Brooks  Sex: male Age: 32 y.o. DOB: 04/12/1980  Allergies: Review of patient's allergies indicates no known allergies.  Chief Complaint  Patient presents with  . Hospitalization Follow-up    HPI: Patient is 32 y.o. male who comes today to establish medical care, he was recently hospitalized with symptoms of abdominal pain and bright red blood per rectum likely hemorrhoids, patient is not on any medications, denies any acute symptoms does complain of some heartburn and epigastric pain, denies any nausea vomiting.  History reviewed. No pertinent past medical history.  Past Surgical History  Procedure Laterality Date  . No past surgeries        Medication List       This list is accurate as of: 02/25/13  4:35 PM.  Always use your most recent med list.               hydrocortisone 25 MG suppository  Commonly known as:  ANUSOL-HC  Place 1 suppository (25 mg total) rectally 2 (two) times daily.     nicotine 21 mg/24hr patch  Commonly known as:  NICODERM CQ  Place 1 patch (21 mg total) onto the skin daily.     omeprazole 20 MG capsule  Commonly known as:  PRILOSEC  Take 1 capsule (20 mg total) by mouth daily.        Meds ordered this encounter  Medications  . hydrocortisone (ANUSOL-HC) 25 MG suppository    Sig: Place 1 suppository (25 mg total) rectally 2 (two) times daily.    Dispense:  12 suppository    Refill:  0  . omeprazole (PRILOSEC) 20 MG capsule    Sig: Take 1 capsule (20 mg total) by mouth daily.    Dispense:  30 capsule    Refill:  3  . nicotine (NICODERM CQ) 21 mg/24hr patch    Sig: Place 1 patch (21 mg total) onto the skin daily.    Dispense:  28 patch    Refill:  0    Immunization History  Administered Date(s) Administered  . Influenza,inj,Quad PF,36+ Mos 02/20/2013  . Pneumococcal Polysaccharide-23 02/20/2013  . Tdap 02/24/2012    History   Substance Use Topics  . Smoking status: Current Every Day Smoker -- 1.00 packs/day for 14 years  . Smokeless tobacco: Not on file  . Alcohol Use: No    Review of Systems  As noted in HPI  Filed Vitals:   02/25/13 1539  BP: 116/66  Pulse: 61  Temp: 99.1 F (37.3 C)  Resp: 16    Physical Exam  Physical Exam  Constitutional: He is oriented to person, place, and time.  Obese male sitting comfortably not in acute distress    Eyes: EOM are normal. Pupils are equal, round, and reactive to light.  Cardiovascular: Normal rate and regular rhythm.   Pulmonary/Chest: Breath sounds normal. No respiratory distress. He has no wheezes. He has no rales.  Abdominal: Bowel sounds are normal. He exhibits no distension. There is no rebound and no guarding.  Minimal epigastric discomfort   Neurological: He is alert and oriented to person, place, and time.    CBC    Component Value Date/Time   WBC 8.6 02/20/2013 0100   RBC 4.72 02/20/2013 0100   HGB 13.1 02/20/2013 0100   HCT 39.1 02/20/2013 0100   PLT 245 02/20/2013 0100   MCV 82.8 02/20/2013 0100  LYMPHSABS 1.9 02/19/2013 1424   MONOABS 0.7 02/19/2013 1424   EOSABS 0.2 02/19/2013 1424   BASOSABS 0.0 02/19/2013 1424    CMP     Component Value Date/Time   NA 138 02/20/2013 0100   K 3.6 02/20/2013 0100   CL 105 02/20/2013 0100   CO2 26 02/20/2013 0100   GLUCOSE 90 02/20/2013 0100   BUN 12 02/20/2013 0100   CREATININE 1.48* 02/20/2013 0100   CALCIUM 8.7 02/20/2013 0100   PROT 6.8 02/19/2013 1424   ALBUMIN 3.6 02/19/2013 1424   AST 27 02/19/2013 1424   ALT 33 02/19/2013 1424   ALKPHOS 49 02/19/2013 1424   BILITOT 0.4 02/19/2013 1424   GFRNONAA 61* 02/20/2013 0100   GFRAA 71* 02/20/2013 0100    No results found for this basename: chol,  tri,  ldl    No components found with this basename: hga1c    Lab Results  Component Value Date/Time   AST 27 02/19/2013  2:24 PM    Assessment and Plan  Hemorrhoid - Plan:  hydrocortisone (ANUSOL-HC) 25 MG suppository  History of substance use  Renal insufficiency - Plan: Repeat COMPLETE METABOLIC PANEL WITH GFR  GERD (gastroesophageal reflux disease) - Plan: omeprazole (PRILOSEC) 20 MG capsule  Smoking... patient will try nicotine patch  Screening - Plan: Lipid panel, TSH, Vit D  25 hydroxy (rtn osteoporosis monitoring), CBC with Differential     Return in about 2 months (around 04/27/2013).  Doris Cheadle, MD

## 2013-04-27 ENCOUNTER — Other Ambulatory Visit: Payer: Self-pay

## 2013-04-27 ENCOUNTER — Ambulatory Visit: Payer: Self-pay | Attending: Internal Medicine

## 2013-04-27 DIAGNOSIS — Z139 Encounter for screening, unspecified: Secondary | ICD-10-CM

## 2013-04-27 DIAGNOSIS — N289 Disorder of kidney and ureter, unspecified: Secondary | ICD-10-CM

## 2013-04-27 LAB — CBC WITH DIFFERENTIAL/PLATELET
Basophils Absolute: 0 10*3/uL (ref 0.0–0.1)
Basophils Relative: 0 % (ref 0–1)
EOS PCT: 3 % (ref 0–5)
Eosinophils Absolute: 0.2 10*3/uL (ref 0.0–0.7)
HCT: 45.1 % (ref 39.0–52.0)
Hemoglobin: 14.7 g/dL (ref 13.0–17.0)
LYMPHS PCT: 26 % (ref 12–46)
Lymphs Abs: 2 10*3/uL (ref 0.7–4.0)
MCH: 27.3 pg (ref 26.0–34.0)
MCHC: 32.6 g/dL (ref 30.0–36.0)
MCV: 83.8 fL (ref 78.0–100.0)
Monocytes Absolute: 0.9 10*3/uL (ref 0.1–1.0)
Monocytes Relative: 13 % — ABNORMAL HIGH (ref 3–12)
NEUTROS PCT: 58 % (ref 43–77)
Neutro Abs: 4.3 10*3/uL (ref 1.7–7.7)
Platelets: 310 10*3/uL (ref 150–400)
RBC: 5.38 MIL/uL (ref 4.22–5.81)
RDW: 15.7 % — AB (ref 11.5–15.5)
WBC: 7.5 10*3/uL (ref 4.0–10.5)

## 2013-04-27 LAB — LIPID PANEL
Cholesterol: 166 mg/dL (ref 0–200)
HDL: 40 mg/dL (ref >39)
LDL CALC: 105 mg/dL — AB (ref 0–99)
TRIGLYCERIDES: 107 mg/dL (ref <150)
Total CHOL/HDL Ratio: 4.2 Ratio
VLDL: 21 mg/dL (ref 0–40)

## 2013-04-27 LAB — COMPLETE METABOLIC PANEL WITH GFR
ALBUMIN: 4.3 g/dL (ref 3.5–5.2)
ALT: 30 U/L (ref 0–53)
AST: 21 U/L (ref 0–37)
Alkaline Phosphatase: 49 U/L (ref 39–117)
BUN: 12 mg/dL (ref 6–23)
CALCIUM: 9.1 mg/dL (ref 8.4–10.5)
CHLORIDE: 105 meq/L (ref 96–112)
CO2: 26 meq/L (ref 19–32)
Creat: 1.41 mg/dL — ABNORMAL HIGH (ref 0.50–1.35)
GFR, EST AFRICAN AMERICAN: 76 mL/min
GFR, Est Non African American: 65 mL/min
Glucose, Bld: 88 mg/dL (ref 70–99)
Potassium: 3.8 mEq/L (ref 3.5–5.3)
SODIUM: 138 meq/L (ref 135–145)
TOTAL PROTEIN: 7.2 g/dL (ref 6.0–8.3)
Total Bilirubin: 0.6 mg/dL (ref 0.3–1.2)

## 2013-04-28 LAB — TSH: TSH: 0.685 u[IU]/mL (ref 0.350–4.500)

## 2013-04-28 LAB — VITAMIN D 25 HYDROXY (VIT D DEFICIENCY, FRACTURES): VIT D 25 HYDROXY: 11 ng/mL — AB (ref 30–89)

## 2013-04-29 ENCOUNTER — Telehealth: Payer: Self-pay

## 2013-04-29 NOTE — Telephone Encounter (Signed)
Message copied by Lestine MountJUAREZ, Ricahrd Schwager L on Wed Apr 29, 2013  9:26 AM ------      Message from: Doris CheadleADVANI, DEEPAK      Created: Tue Apr 28, 2013 11:10 AM       Blood work reviewed, noticed low vitamin D, call patient advise to start ergocalciferol 50,000 units once a week for the duration of  12 weeks.       ------

## 2013-04-29 NOTE — Telephone Encounter (Signed)
Patient not available  Left message on machine to return our call 

## 2013-04-30 ENCOUNTER — Ambulatory Visit: Payer: Self-pay | Admitting: Internal Medicine

## 2013-12-01 ENCOUNTER — Ambulatory Visit (HOSPITAL_COMMUNITY)
Admission: RE | Admit: 2013-12-01 | Discharge: 2013-12-01 | Disposition: A | Discharge status: Home / Self Care | Payer: Self-pay | Source: Ambulatory Visit | Attending: Internal Medicine | Admitting: Internal Medicine

## 2013-12-01 ENCOUNTER — Other Ambulatory Visit: Payer: Self-pay

## 2013-12-01 ENCOUNTER — Encounter: Payer: Self-pay | Admitting: Internal Medicine

## 2013-12-01 ENCOUNTER — Ambulatory Visit: Payer: Self-pay | Attending: Internal Medicine | Admitting: Internal Medicine

## 2013-12-01 VITALS — BP 115/70 | HR 69 | Temp 98.8°F | Resp 16 | Wt 278.0 lb

## 2013-12-01 DIAGNOSIS — Z113 Encounter for screening for infections with a predominantly sexual mode of transmission: Secondary | ICD-10-CM

## 2013-12-01 DIAGNOSIS — J320 Chronic maxillary sinusitis: Secondary | ICD-10-CM

## 2013-12-01 DIAGNOSIS — R059 Cough, unspecified: Secondary | ICD-10-CM

## 2013-12-01 DIAGNOSIS — F17200 Nicotine dependence, unspecified, uncomplicated: Secondary | ICD-10-CM | POA: Insufficient documentation

## 2013-12-01 DIAGNOSIS — R05 Cough: Secondary | ICD-10-CM | POA: Insufficient documentation

## 2013-12-01 DIAGNOSIS — R079 Chest pain, unspecified: Secondary | ICD-10-CM

## 2013-12-01 DIAGNOSIS — IMO0001 Reserved for inherently not codable concepts without codable children: Secondary | ICD-10-CM

## 2013-12-01 MED ORDER — VARENICLINE TARTRATE 0.5 MG X 11 & 1 MG X 42 PO MISC: ORAL | Status: DC

## 2013-12-01 MED ORDER — AMOXICILLIN-POT CLAVULANATE 875-125 MG PO TABS: 1.0000 | ORAL_TABLET | Freq: Two times a day (BID) | ORAL | Status: DC

## 2013-12-01 MED ORDER — METRONIDAZOLE 500 MG PO TABS: 2000.0000 mg | ORAL_TABLET | Freq: Once | ORAL | Status: DC

## 2013-12-01 MED ORDER — BENZONATATE 100 MG PO CAPS: 100.0000 mg | ORAL_CAPSULE | Freq: Three times a day (TID) | ORAL | Status: DC | PRN

## 2013-12-01 NOTE — Progress Notes (Signed)
Patient complains of sinus pressure and congestion  Has been having chest pains for the past week

## 2013-12-01 NOTE — Progress Notes (Signed)
MRN: [id number] Name: Dylan Brooks  Sex: male Age: 33 y.o. DOB: 10-10-1980  Allergies: Review of patient's allergies indicates no known allergies.  Chief Complaint  Patient presents with  . Sinusitis  . Chest Pain    HPI: Patient is 33 y.o. male who comes today reported to have sinus congestion postnasal drip cough chest pain which as per patient is worse with coughing, denies any fever chills or shortness of breath, patient has tried over-the-counter antiallergy medications without much improvement, denies any nausea vomiting, patient is to smoke cigarettes, I have advised patient to quit smoking he wants to try Chantix, patient also wants to be checked for STDs as well as his girlfriend was diagnosed with, trichomoniasis.  No past medical history on file.  Past Surgical History  Procedure Laterality Date  . No past surgeries        Medication List       This list is accurate as of: 12/01/13  4:18 PM.  Always use your most recent med list.               amoxicillin-clavulanate 875-125 MG per tablet  Commonly known as:  AUGMENTIN  Take 1 tablet by mouth 2 (two) times daily.     benzonatate 100 MG capsule  Commonly known as:  TESSALON  Take 1 capsule (100 mg total) by mouth 3 (three) times daily as needed for cough.     hydrocortisone 25 MG suppository  Commonly known as:  ANUSOL-HC  Place 1 suppository (25 mg total) rectally 2 (two) times daily.     nicotine 21 mg/24hr patch  Commonly known as:  NICODERM CQ  Place 1 patch (21 mg total) onto the skin daily.     omeprazole 20 MG capsule  Commonly known as:  PRILOSEC  Take 1 capsule (20 mg total) by mouth daily.     varenicline 0.5 MG X 11 & 1 MG X 42 tablet  Commonly known as:  CHANTIX STARTING MONTH PAK  Take one 0.5 mg tablet by mouth once daily for 3 days, then increase to one 0.5 mg tablet twice daily for 4 days, then increase to one 1 mg tablet twice daily.        Meds ordered this encounter    Medications  . varenicline (CHANTIX STARTING MONTH PAK) 0.5 MG X 11 & 1 MG X 42 tablet    Sig: Take one 0.5 mg tablet by mouth once daily for 3 days, then increase to one 0.5 mg tablet twice daily for 4 days, then increase to one 1 mg tablet twice daily.    Dispense:  53 tablet    Refill:  0  . benzonatate (TESSALON) 100 MG capsule    Sig: Take 1 capsule (100 mg total) by mouth 3 (three) times daily as needed for cough.    Dispense:  30 capsule    Refill:  1  . amoxicillin-clavulanate (AUGMENTIN) 875-125 MG per tablet    Sig: Take 1 tablet by mouth 2 (two) times daily.    Dispense:  20 tablet    Refill:  0    Immunization History  Administered Date(s) Administered  . Influenza,inj,Quad PF,36+ Mos 02/20/2013  . Pneumococcal Polysaccharide-23 02/20/2013  . Tdap 02/24/2012    Family History  Problem Relation Age of Onset  . Cancer Mother   . Hypertension Mother   . Cancer Maternal Grandfather   . Diabetes Paternal Grandfather   . Stroke Paternal Grandfather     History  Substance Use Topics  . Smoking status: Current Every Day Smoker -- 1.00 packs/day for 14 years  . Smokeless tobacco: Not on file  . Alcohol Use: No    Review of Systems   As noted in HPI  Filed Vitals:   12/01/13 1551  BP: 115/70  Pulse: 69  Temp: 98.8 F (37.1 C)  Resp: 16    Physical Exam  Physical Exam  Constitutional: No distress.  HENT:  Sinus congestion and tenderness   Eyes: EOM are normal. Pupils are equal, round, and reactive to light.  Cardiovascular: Normal rate and regular rhythm.   Pulmonary/Chest: Breath sounds normal. No respiratory distress. He has no wheezes. He has no rales.  Chest wall tenderness with the palpation  Genitourinary:  No apparent penile discharge rash or lumps, testes nontender  Musculoskeletal: He exhibits no edema.    CBC    Component Value Date/Time   WBC 7.5 04/27/2013 1544   RBC 5.38 04/27/2013 1544   HGB 14.7 04/27/2013 1544   HCT 45.1  04/27/2013 1544   PLT 310 04/27/2013 1544   MCV 83.8 04/27/2013 1544   LYMPHSABS 2.0 04/27/2013 1544   MONOABS 0.9 04/27/2013 1544   EOSABS 0.2 04/27/2013 1544   BASOSABS 0.0 04/27/2013 1544    CMP     Component Value Date/Time   NA 138 04/27/2013 1544   K 3.8 04/27/2013 1544   CL 105 04/27/2013 1544   CO2 26 04/27/2013 1544   GLUCOSE 88 04/27/2013 1544   BUN 12 04/27/2013 1544   CREATININE 1.41* 04/27/2013 1544   CREATININE 1.48* 02/20/2013 0100   CALCIUM 9.1 04/27/2013 1544   PROT 7.2 04/27/2013 1544   ALBUMIN 4.3 04/27/2013 1544   AST 21 04/27/2013 1544   ALT 30 04/27/2013 1544   ALKPHOS 49 04/27/2013 1544   BILITOT 0.6 04/27/2013 1544   GFRNONAA 65 04/27/2013 1544   GFRNONAA 61* 02/20/2013 0100   GFRAA 76 04/27/2013 1544   GFRAA 71* 02/20/2013 0100    Lab Results  Component Value Date/Time   CHOL 166 04/27/2013  3:44 PM    No components found with this basename: hga1c    Lab Results  Component Value Date/Time   AST 21 04/27/2013  3:44 PM    Assessment and Plan  Maxillary sinusitis, unspecified chronicity - Plan: amoxicillin-clavulanate (AUGMENTIN) 875-125 MG per tablet, also advise patient for saltwater gargles.  Cough - Plan: benzonatate (TESSALON) 100 MG capsule  Chest pain, unspecified chest pain type - Plan: EKG 12-Lead unchanged most likely musculoskeletal pain.  Smoking - Plan: Started patient on varenicline (CHANTIX STARTING MONTH PAK) 0.5 MG X 11 & 1 MG X 42 tablet  Screen for STD (sexually transmitted disease) - Plan: Will check GC/chlamydia probe amp, urine, HIV antibody (with reflex), since patient reported that his sexual partner was recently treated for trichomoniasis, I have prescribed Flagyl 2 g dose, advise patient to use condoms.     Return in about 3 months (around 03/02/2014).  Doris Cheadle, MD

## 2013-12-02 LAB — GC/CHLAMYDIA PROBE AMP, URINE
Chlamydia, Swab/Urine, PCR: NEGATIVE
GC Probe Amp, Urine: NEGATIVE

## 2013-12-02 LAB — HIV ANTIBODY (ROUTINE TESTING W REFLEX): HIV: NONREACTIVE

## 2013-12-03 ENCOUNTER — Telehealth: Payer: Self-pay | Admitting: Emergency Medicine

## 2013-12-03 NOTE — Telephone Encounter (Signed)
Message copied by Darlis Loan on Thu Dec 03, 2013  1:13 PM ------      Message from: Doris Cheadle      Created: Wed Dec 02, 2013  9:31 AM       Call and let the patient know that his STD test is negative. ------

## 2013-12-03 NOTE — Telephone Encounter (Signed)
Left message for pt to call when message received 

## 2015-04-06 ENCOUNTER — Emergency Department (HOSPITAL_COMMUNITY)
Admission: EM | Admit: 2015-04-06 | Discharge: 2015-04-07 | Disposition: A | Discharge status: Home / Self Care | Payer: Self-pay | Attending: Emergency Medicine | Admitting: Emergency Medicine

## 2015-04-06 ENCOUNTER — Encounter (HOSPITAL_COMMUNITY): Payer: Self-pay | Admitting: Family Medicine

## 2015-04-06 ENCOUNTER — Emergency Department (HOSPITAL_COMMUNITY): Payer: Self-pay

## 2015-04-06 DIAGNOSIS — R1084 Generalized abdominal pain: Secondary | ICD-10-CM | POA: Insufficient documentation

## 2015-04-06 DIAGNOSIS — R1032 Left lower quadrant pain: Secondary | ICD-10-CM | POA: Insufficient documentation

## 2015-04-06 DIAGNOSIS — K921 Melena: Secondary | ICD-10-CM | POA: Insufficient documentation

## 2015-04-06 DIAGNOSIS — R197 Diarrhea, unspecified: Secondary | ICD-10-CM | POA: Insufficient documentation

## 2015-04-06 DIAGNOSIS — F17200 Nicotine dependence, unspecified, uncomplicated: Secondary | ICD-10-CM | POA: Insufficient documentation

## 2015-04-06 DIAGNOSIS — Z792 Long term (current) use of antibiotics: Secondary | ICD-10-CM | POA: Insufficient documentation

## 2015-04-06 LAB — URINALYSIS, ROUTINE W REFLEX MICROSCOPIC
Bilirubin Urine: NEGATIVE
GLUCOSE, UA: NEGATIVE mg/dL
KETONES UR: NEGATIVE mg/dL
LEUKOCYTES UA: NEGATIVE
NITRITE: NEGATIVE
PROTEIN: NEGATIVE mg/dL
Specific Gravity, Urine: 1.026 (ref 1.005–1.030)
pH: 5 (ref 5.0–8.0)

## 2015-04-06 LAB — URINE MICROSCOPIC-ADD ON: WBC UA: NONE SEEN WBC/hpf (ref 0–5)

## 2015-04-06 LAB — COMPREHENSIVE METABOLIC PANEL
ALBUMIN: 3.8 g/dL (ref 3.5–5.0)
ALT: 38 U/L (ref 17–63)
AST: 25 U/L (ref 15–41)
Alkaline Phosphatase: 52 U/L (ref 38–126)
Anion gap: 9 (ref 5–15)
BUN: 14 mg/dL (ref 6–20)
CO2: 23 mmol/L (ref 22–32)
Calcium: 9.5 mg/dL (ref 8.9–10.3)
Chloride: 107 mmol/L (ref 101–111)
Creatinine, Ser: 1.37 mg/dL — ABNORMAL HIGH (ref 0.61–1.24)
GFR calc Af Amer: >60 mL/min (ref >60)
GLUCOSE: 90 mg/dL (ref 65–99)
POTASSIUM: 4.2 mmol/L (ref 3.5–5.1)
Sodium: 139 mmol/L (ref 135–145)
TOTAL PROTEIN: 7.9 g/dL (ref 6.5–8.1)
Total Bilirubin: 0.6 mg/dL (ref 0.3–1.2)

## 2015-04-06 LAB — CBC
HEMATOCRIT: 45.2 % (ref 39.0–52.0)
HEMOGLOBIN: 14.7 g/dL (ref 13.0–17.0)
MCH: 27 pg (ref 26.0–34.0)
MCHC: 32.5 g/dL (ref 30.0–36.0)
MCV: 82.9 fL (ref 78.0–100.0)
Platelets: 364 10*3/uL (ref 150–400)
RBC: 5.45 MIL/uL (ref 4.22–5.81)
RDW: 14.6 % (ref 11.5–15.5)
WBC: 14 10*3/uL — AB (ref 4.0–10.5)

## 2015-04-06 LAB — LIPASE, BLOOD: Lipase: 43 U/L (ref 11–51)

## 2015-04-06 MED ORDER — ONDANSETRON HCL 4 MG/2ML IJ SOLN
4.0000 mg | Freq: Once | INTRAMUSCULAR | Status: AC
  Administered 2015-04-06: 4 mg via INTRAVENOUS
  Filled 2015-04-06: qty 2

## 2015-04-06 MED ORDER — IOHEXOL 300 MG/ML  SOLN
100.0000 mL | Freq: Once | INTRAMUSCULAR | Status: AC | PRN
  Administered 2015-04-06: 100 mL via INTRAVENOUS

## 2015-04-06 MED ORDER — MORPHINE SULFATE (PF) 4 MG/ML IV SOLN
4.0000 mg | Freq: Once | INTRAVENOUS | Status: AC
  Administered 2015-04-06: 4 mg via INTRAVENOUS
  Filled 2015-04-06: qty 1

## 2015-04-06 MED ORDER — DICYCLOMINE HCL 20 MG PO TABS: 20.0000 mg | ORAL_TABLET | Freq: Two times a day (BID) | ORAL | Status: DC

## 2015-04-06 MED ORDER — SODIUM CHLORIDE 0.9 % IV BOLUS (SEPSIS)
1000.0000 mL | Freq: Once | INTRAVENOUS | Status: AC
  Administered 2015-04-06: 1000 mL via INTRAVENOUS

## 2015-04-06 NOTE — Discharge Instructions (Signed)
Take Bentyl as prescribed as needed for abdominal cramping. Follow-up with primary care doctor for recheck if continued to have blood in stool and abdominal pain. Return if any new concerning symptom or large amount of blood.   Abdominal Pain, Adult Many things can cause belly (abdominal) pain. Most times, the belly pain is not dangerous. Many cases of belly pain can be watched and treated at home. HOME CARE   Do not take medicines that help you go poop (laxatives) unless told to by your doctor.  Only take medicine as told by your doctor.  Eat or drink as told by your doctor. Your doctor will tell you if you should be on a special diet. GET HELP IF:  You do not know what is causing your belly pain.  You have belly pain while you are sick to your stomach (nauseous) or have runny poop (diarrhea).  You have pain while you pee or poop.  Your belly pain wakes you up at night.  You have belly pain that gets worse or better when you eat.  You have belly pain that gets worse when you eat fatty foods.  You have a fever. GET HELP RIGHT AWAY IF:   The pain does not go away within 2 hours.  You keep throwing up (vomiting).  The pain changes and is only in the right or left part of the belly.  You have bloody or tarry looking poop. MAKE SURE YOU:   Understand these instructions.  Will watch your condition.  Will get help right away if you are not doing well or get worse.   This information is not intended to replace advice given to you by your health care provider. Make sure you discuss any questions you have with your health care provider.   Document Released: 09/05/2007 Document Revised: 04/09/2014 Document Reviewed: 11/26/2012 Elsevier Interactive Patient Education [employer]2016 [employer]

## 2015-04-06 NOTE — ED Provider Notes (Signed)
CSN: [id number]     Arrival date & time 04/06/15  1504 History   First MD Initiated Contact with Patient 04/06/15 2134     Chief Complaint  Patient presents with  . Abdominal Pain     (Consider location/radiation/quality/duration/timing/severity/associated sxs/prior Treatment) HPI Dylan Brooks is a 35 y.o. male with no medical problems, presents to ED with complaint of lower abdominal pain, rectal bleeding. Pt states pain in lower abdomen for 2 days. Reports solid stool with maroon blood in stool this morning, since then two loose bowel movements with no more blood. Denies rectal pain. No pain with BM. No N/V. Hx of similar symptoms, was hospitalized for the same 2 years ago. Denies fever or chills. Took amoxicillin when pain started which did not help. Denies back pain. No urinary symptoms. No other complaints.   History reviewed. No pertinent past medical history. Past Surgical History  Procedure Laterality Date  . No past surgeries     Family History  Problem Relation Age of Onset  . Cancer Mother   . Hypertension Mother   . Cancer Maternal Grandfather   . Diabetes Paternal Grandfather   . Stroke Paternal Grandfather    Social History  Substance Use Topics  . Smoking status: Current Every Day Smoker -- 1.00 packs/day for 14 years  . Smokeless tobacco: None  . Alcohol Use: No    Review of Systems  Constitutional: Negative for fever and chills.  Respiratory: Negative for cough, chest tightness and shortness of breath.   Cardiovascular: Negative for chest pain, palpitations and leg swelling.  Gastrointestinal: Positive for abdominal pain, diarrhea and blood in stool. Negative for nausea, vomiting and abdominal distention.  Genitourinary: Negative for dysuria, urgency, frequency and hematuria.  Musculoskeletal: Negative for myalgias, arthralgias, neck pain and neck stiffness.  Skin: Negative for rash.  Allergic/Immunologic: Negative for immunocompromised state.  Neurological:  Negative for dizziness, weakness, light-headedness, numbness and headaches.  All other systems reviewed and are negative.     Allergies  Review of patient's allergies indicates no known allergies.  Home Medications   Prior to Admission medications   Medication Sig Start Date End Date Taking? Authorizing Provider  amoxicillin-clavulanate (AUGMENTIN) 875-125 MG per tablet Take 1 tablet by mouth 2 (two) times daily. 12/01/13  Yes Doris Cheadleeepak Advani, MD  benzonatate (TESSALON) 100 MG capsule Take 1 capsule (100 mg total) by mouth 3 (three) times daily as needed for cough. Patient not taking: Reported on 04/06/2015 12/01/13   Doris Cheadleeepak Advani, MD  hydrocortisone (ANUSOL-HC) 25 MG suppository Place 1 suppository (25 mg total) rectally 2 (two) times daily. Patient not taking: Reported on 04/06/2015 02/25/13   Doris Cheadleeepak Advani, MD  metroNIDAZOLE (FLAGYL) 500 MG tablet Take 4 tablets (2,000 mg total) by mouth once. Patient not taking: Reported on 04/06/2015 12/01/13   Doris Cheadleeepak Advani, MD  nicotine (NICODERM CQ) 21 mg/24hr patch Place 1 patch (21 mg total) onto the skin daily. Patient not taking: Reported on 04/06/2015 02/25/13   Doris Cheadleeepak Advani, MD  omeprazole (PRILOSEC) 20 MG capsule Take 1 capsule (20 mg total) by mouth daily. Patient not taking: Reported on 04/06/2015 02/25/13   Doris Cheadleeepak Advani, MD  varenicline (CHANTIX STARTING MONTH PAK) 0.5 MG X 11 & 1 MG X 42 tablet Take one 0.5 mg tablet by mouth once daily for 3 days, then increase to one 0.5 mg tablet twice daily for 4 days, then increase to one 1 mg tablet twice daily. Patient not taking: Reported on 04/06/2015 12/01/13   Doris Cheadleeepak Advani, MD  BP 127/80 mmHg  Pulse 78  Temp(Src) 98.1 F (36.7 C) (Oral)  Resp 18  Ht 5\' 11"  (1.803 m)  Wt 125.646 kg  BMI 38.65 kg/m2  SpO2 100% Physical Exam  Constitutional: He appears well-developed and well-nourished. No distress.  HENT:  Head: Normocephalic and atraumatic.  Eyes: Conjunctivae are normal.  Neck: Neck supple.   Cardiovascular: Normal rate, regular rhythm and normal heart sounds.   Pulmonary/Chest: Effort normal. No respiratory distress. He has no wheezes. He has no rales.  Abdominal: Soft. Bowel sounds are normal. He exhibits no distension. There is tenderness. There is no rebound.  Diffuse tenderness, worse in LLQ  Musculoskeletal: He exhibits no edema.  Neurological: He is alert.  Skin: Skin is warm and dry.  Nursing note and vitals reviewed.   ED Course  Procedures (including critical care time) Labs Review Labs Reviewed  COMPREHENSIVE METABOLIC PANEL - Abnormal; Notable for the following:    Creatinine, Ser 1.37 (*)    All other components within normal limits  CBC - Abnormal; Notable for the following:    WBC 14.0 (*)    All other components within normal limits  URINALYSIS, ROUTINE W REFLEX MICROSCOPIC (NOT AT [facility]) - Abnormal; Notable for the following:    Hgb urine dipstick MODERATE (*)    All other components within normal limits  URINE MICROSCOPIC-ADD ON - Abnormal; Notable for the following:    Squamous Epithelial / LPF 0-5 (*)    Bacteria, UA RARE (*)    All other components within normal limits  LIPASE, BLOOD    Imaging Review Ct Abdomen Pelvis W Contrast  04/06/2015  CLINICAL DATA:  Left lower quadrant pain and bloody stools. Leukocytosis. EXAM: CT ABDOMEN AND PELVIS WITH CONTRAST TECHNIQUE: Multidetector CT imaging of the abdomen and pelvis was performed using the standard protocol following bolus administration of intravenous contrast. CONTRAST:  OMNIPAQUE IOHEXOL 300 MG/ML  SOLN COMPARISON:  None. FINDINGS: Lower chest:  Lung bases are clear. Hepatobiliary: 0.8 cm low-density structure at the left hepatic dome is indeterminate. Subtle 1.1 cm low-density structure along the right hepatic dome is indeterminate. These are likely incidental findings in a patient of this age and could represent small cavernous hemangiomas. Normal appearance of the gallbladder and portal  venous system is patent. Pancreas: Normal appearance of the pancreas without inflammation or duct dilatation. Spleen: Normal appearance of the spleen without enlargement. Adrenals/Urinary Tract: Normal appearance of the adrenal glands. Question small cysts in left kidney. No acute abnormalities involving the kidneys and no hydronephrosis. Stomach/Bowel: Normal appearance of stomach and duodenum. There is fluid within the small and large bowel without distention and no evidence for obstruction. No significant bowel wall thickening. Normal appearance of the appendix. Vascular/Lymphatic: No pathologically enlarged lymph nodes. No evidence of abdominal aortic aneurysm. Reproductive: Normal appearance of the prostate and seminal vesicles. Other: No free fluid.  No free air. Musculoskeletal:  No suspicious bone lesions identified. IMPRESSION: No acute abnormality in the abdomen or pelvis. Small low-density structures in the liver are probably incidental findings in a patient of this age. These could represent small cavernous hemangiomas. Probable small left renal cysts. Electronically Signed   By: Richarda Overlie M.D.   On: 04/06/2015 23:52   I have personally reviewed and evaluated these images and lab results as part of my medical decision-making.   EKG Interpretation None      MDM   Final diagnoses:  Left lower quadrant pain  Blood in stool   Patient  with diffuse abdominal pain, worse in left lower quadrant. Reports some blood in his stool that he noticed earlier today, none since then. Labs obtained by Triage, show elevated WBC of 14, also elevated creatinine at 1.37 which actually appears to be at baseline. Pt was admitted for similar pain 2 years ago and states he has had several similar episodes since just not as severe. No CT scan in system, question possible colitis vs diverticulitis. Will get CT abd and pelvis. VS normal. Hemoglobin normal.   Filed Vitals:   04/06/15 1611  BP: 127/80  Pulse: 78   Temp: 98.1 F (36.7 C)  TempSrc: Oral  Resp: 18  Height: 5\' 11"  (1.803 m)  Weight: 125.646 kg  SpO2: 100%    CT abdomen and pelvis negative. Patient is in no acute distress. Asking for food, states he has no nausea or vomiting. Stable for discharge home   Jaynie Crumble, [place] 04/07/15 1610  Zadie Rhine, MD 04/07/15 [phone]

## 2015-04-06 NOTE — ED Notes (Signed)
Pt to CT

## 2015-04-06 NOTE — ED Notes (Signed)
Pt here for possible bleeding ulcer. sts rectal bleeding and diarrhea. Hx of same.

## 2015-04-07 MED ORDER — KETOROLAC TROMETHAMINE 30 MG/ML IJ SOLN
30.0000 mg | Freq: Once | INTRAMUSCULAR | Status: AC
  Administered 2015-04-07: 30 mg via INTRAVENOUS
  Filled 2015-04-07: qty 1

## 2015-12-26 ENCOUNTER — Ambulatory Visit (HOSPITAL_COMMUNITY)
Admission: EM | Admit: 2015-12-26 | Discharge: 2015-12-26 | Disposition: A | Discharge status: Home / Self Care | Payer: Self-pay

## 2016-09-25 ENCOUNTER — Emergency Department (HOSPITAL_COMMUNITY): Payer: Self-pay

## 2016-09-25 ENCOUNTER — Encounter (HOSPITAL_COMMUNITY): Payer: Self-pay | Admitting: Emergency Medicine

## 2016-09-25 ENCOUNTER — Emergency Department (HOSPITAL_COMMUNITY)
Admission: EM | Admit: 2016-09-25 | Discharge: 2016-09-25 | Disposition: A | Discharge status: Home / Self Care | Payer: Self-pay | Attending: Emergency Medicine | Admitting: Emergency Medicine

## 2016-09-25 DIAGNOSIS — J069 Acute upper respiratory infection, unspecified: Secondary | ICD-10-CM | POA: Insufficient documentation

## 2016-09-25 DIAGNOSIS — F17200 Nicotine dependence, unspecified, uncomplicated: Secondary | ICD-10-CM | POA: Insufficient documentation

## 2016-09-25 LAB — BASIC METABOLIC PANEL
Anion gap: 5 (ref 5–15)
BUN: 14 mg/dL (ref 6–20)
CHLORIDE: 106 mmol/L (ref 101–111)
CO2: 28 mmol/L (ref 22–32)
CREATININE: 1.5 mg/dL — AB (ref 0.61–1.24)
Calcium: 8.9 mg/dL (ref 8.9–10.3)
GFR calc non Af Amer: 59 mL/min — ABNORMAL LOW (ref >60)
GLUCOSE: 91 mg/dL (ref 65–99)
Potassium: 3.7 mmol/L (ref 3.5–5.1)
Sodium: 139 mmol/L (ref 135–145)

## 2016-09-25 LAB — CBC
HCT: 42.4 % (ref 39.0–52.0)
HEMOGLOBIN: 14.2 g/dL (ref 13.0–17.0)
MCH: 27.5 pg (ref 26.0–34.0)
MCHC: 33.5 g/dL (ref 30.0–36.0)
MCV: 82.2 fL (ref 78.0–100.0)
Platelets: 273 10*3/uL (ref 150–400)
RBC: 5.16 MIL/uL (ref 4.22–5.81)
RDW: 14.3 % (ref 11.5–15.5)
WBC: 12.2 10*3/uL — ABNORMAL HIGH (ref 4.0–10.5)

## 2016-09-25 LAB — POCT I-STAT TROPONIN I: Troponin i, poc: 0 ng/mL (ref 0.00–0.08)

## 2016-09-25 MED ORDER — AZITHROMYCIN 250 MG PO TABS: 250.0000 mg | ORAL_TABLET | Freq: Every day | ORAL | 0 refills | Status: DC

## 2016-09-25 MED ORDER — BENZONATATE 100 MG PO CAPS: 100.0000 mg | ORAL_CAPSULE | Freq: Three times a day (TID) | ORAL | 0 refills | Status: DC | PRN

## 2016-09-25 NOTE — Discharge Instructions (Signed)
It was my pleasure taking care of you today! ° °Your symptoms are likely due to a viral upper respiratory infection. Fortunately, we did not see evidence of serious infection and can treat your symptoms. Flonase and mucinex for nasal congestion, tessalon as needed for cough. Alternate between Tylenol and ibuprofen as needed for body aches / fevers.  ° °Rest, drink plenty of fluids to be sure you are staying hydrated.  ° °Please follow up with your primary doctor for discussion of your diagnoses and further evaluation after today's visit if symptoms persist longer than 7 days; Return to the ER for high fevers, difficulty breathing or other concerning symptoms ° °

## 2016-09-25 NOTE — ED Provider Notes (Signed)
[facility]-EMERGENCY DEPT Provider Note   CSN: [id number] Arrival date & time: 09/25/16  1347  By signing my name below, I, [name], attest that this documentation has been prepared under the direction and in the presence of [facility], PA-C. Electronically Signed: [name], Scribe. 09/25/2016. 6:26 PM.  History   Chief Complaint Chief Complaint  Patient presents with  . Chest Pain  . Cough   The history is provided by the patient. No language interpreter was used.    HPI Comments:  Dylan Brooks is a 36 y.o. male who presents to the Emergency Department complaining of an intermittently productive cough x ~3 days; worse since last night. He describes yellow sputum with his cough. Pt reports associated mild SOB, postnasal drip, nausea, 1 episode of post-tussive emesis, congestion, and chest tightness. He also notes HA and pain in his neck. He states the chest tightness and neck pain are worse with cough. He has taken [facility] powder with moderate relief of his HA.  Pt denies h/o same. No fever. + current every day smoker.   History reviewed. No pertinent past medical history.  Patient Active Problem List   Diagnosis Date Noted  . Hemorrhoid 02/20/2013  . Abdominal pain, epigastric 02/20/2013  . BRBPR (bright red blood per rectum) 02/20/2013  . Orthostatic hypotension 02/19/2013  . Acute kidney injury (HCC) 02/19/2013  . Lower GI bleeding 02/19/2013    Past Surgical History:  Procedure Laterality Date  . NO PAST SURGERIES         Home Medications    Prior to Admission medications   Medication Sig Start Date End Date Taking? Authorizing Provider  amoxicillin-clavulanate (AUGMENTIN) 875-125 MG per tablet Take 1 tablet by mouth 2 (two) times daily. 12/01/13   Doris Cheadle, MD  azithromycin (ZITHROMAX) 250 MG tablet Take 1 tablet (250 mg total) by mouth daily. Take first 2 tablets together, then 1 every day until finished. Do not fill until 6/29 09/28/16   Muriel Wilber, Chase Picket,  PA-C  benzonatate (TESSALON) 100 MG capsule Take 1 capsule (100 mg total) by mouth 3 (three) times daily as needed for cough. 09/25/16   Nesreen Albano, Chase Picket, PA-C  dicyclomine (BENTYL) 20 MG tablet Take 1 tablet (20 mg total) by mouth 2 (two) times daily. 04/06/15   Kirichenko, Lemont Fillers, PA-C  hydrocortisone (ANUSOL-HC) 25 MG suppository Place 1 suppository (25 mg total) rectally 2 (two) times daily. Patient not taking: Reported on 04/06/2015 02/25/13   Doris Cheadle, MD  metroNIDAZOLE (FLAGYL) 500 MG tablet Take 4 tablets (2,000 mg total) by mouth once. Patient not taking: Reported on 04/06/2015 12/01/13   Doris Cheadle, MD  nicotine (NICODERM CQ) 21 mg/24hr patch Place 1 patch (21 mg total) onto the skin daily. Patient not taking: Reported on 04/06/2015 02/25/13   Doris Cheadle, MD  omeprazole (PRILOSEC) 20 MG capsule Take 1 capsule (20 mg total) by mouth daily. Patient not taking: Reported on 04/06/2015 02/25/13   Doris Cheadle, MD  varenicline (CHANTIX STARTING MONTH PAK) 0.5 MG X 11 & 1 MG X 42 tablet Take one 0.5 mg tablet by mouth once daily for 3 days, then increase to one 0.5 mg tablet twice daily for 4 days, then increase to one 1 mg tablet twice daily. Patient not taking: Reported on 04/06/2015 12/01/13   Doris Cheadle, MD    Family History Family History  Problem Relation Age of Onset  . Cancer Mother   . Hypertension Mother   . Cancer Maternal Grandfather   .  Diabetes Paternal Grandfather   . Stroke Paternal Grandfather     Social History Social History  Substance Use Topics  . Smoking status: Current Every Day Smoker    Packs/day: 1.00    Years: 14.00  . Smokeless tobacco: Not on file  . Alcohol use No     Allergies   Patient has no known allergies.   Review of Systems Review of Systems  Constitutional: Negative for chills and fever.  HENT: Positive for congestion and postnasal drip.   Respiratory: Positive for cough, chest tightness and shortness of breath.     Musculoskeletal: Positive for neck pain.  Neurological: Positive for headaches.  All other systems reviewed and are negative.    Physical Exam Updated Vital Signs BP 124/81 (BP Location: Right Arm)   Pulse 75   Temp 98.2 F (36.8 C) (Oral)   Resp 18   SpO2 97%   Physical Exam  Constitutional: He is oriented to person, place, and time. He appears well-developed and well-nourished. No distress.  HENT:  Head: Normocephalic and atraumatic.  OP with erythema, no exudates or tonsillar hypertrophy. + nasal congestion with mucosal edema. No focal areas of sinus tenderness.  Neck: Normal range of motion. Neck supple.  No meningeal signs.   Cardiovascular: Normal rate, regular rhythm and normal heart sounds.   Pulmonary/Chest: Effort normal.  Lungs are clear to auscultation bilaterally - no w/r/r  Abdominal: Soft. He exhibits no distension. There is no tenderness.  Musculoskeletal: Normal range of motion.  Neurological: He is alert and oriented to person, place, and time.  Skin: Skin is warm and dry. He is not diaphoretic.  Nursing note and vitals reviewed.    ED Treatments / Results  DIAGNOSTIC STUDIES:  Oxygen Saturation is 97% on RA, normal by my interpretation.    COORDINATION OF CARE:  6:22 PM Discussed treatment plan with pt at bedside and pt agreed to plan.  Labs (all labs ordered are listed, but only abnormal results are displayed) Labs Reviewed  BASIC METABOLIC PANEL - Abnormal; Notable for the following:       Result Value   Creatinine, Ser 1.50 (*)    GFR calc non Af Amer 59 (*)    All other components within normal limits  CBC - Abnormal; Notable for the following:    WBC 12.2 (*)    All other components within normal limits  I-STAT TROPOININ, ED  POCT I-STAT TROPONIN I    EKG  EKG Interpretation  Date/Time:  Tuesday September 25 2016 14:14:02 EDT Ventricular Rate:  79 PR Interval:    QRS Duration: 89 QT Interval:  371 QTC Calculation: 426 R  Axis:   60 Text Interpretation:  Sinus rhythm Probable left atrial enlargement Baseline wander in lead(s) V4 No significant change since last tracing Confirmed by Linwood DibblesKnapp, Jon [phone]) on 09/25/2016 6:32:19 PM       Radiology Dg Chest 2 View  Result Date: 09/25/2016 CLINICAL DATA:  Chest pain and congestion, cough and wheezing with shortness of breath. Current smoker. EXAM: CHEST  2 VIEW COMPARISON:  Report of a chest x-ray of December 07, 2000 FINDINGS: The lungs are adequately inflated. There is no focal infiltrate. There is no pleural effusion. The heart and pulmonary vascularity are normal. The mediastinum is normal in width. The bony thorax exhibits no acute abnormality. IMPRESSION: There is no active cardiopulmonary disease. Electronically Signed   By: David  [place]azil[place] M.D.   On: 09/25/2016 14:57    Procedures Procedures (including critical  care time)  Medications Ordered in ED Medications - No data to display   Initial Impression / Assessment and Plan / ED Course  I have reviewed the triage vital signs and the nursing notes.  Pertinent labs & imaging results that were available during my care of the patient were reviewed by me and considered in my medical decision making (see chart for details).     Dylan Brooks is a 36 y.o. male who presents to ED for cough, congestion, nausea, post-tussive emesis.   On exam, patient is afebrile, non-toxic appearing with a clear lung exam. Mild rhinorrhea and OP with erythema but no exudates or tonsillar hypertrophy.  CXR negative  Sxs today likely due to viral URI.Symptomatic home care instructions discussed. Rx for tessalon given. Patient requesting antibiotics. Do not believe ABX are warranted at this time as he has only had symptoms for 3 days. However, given he is a chronic every day smoker will give ABX TO HOLD if symptoms are not improved after 1 full week of symptoms. Rx for azithro written with instructions not to fill until after 6/30  which would be full week. Patient instructed on home care instructions. He understands to fill ABX only if symptoms are not improving after 1 week. PCP follow up strongly encouraged if symptoms persist. Reasons to return to ER discussed. All questions answered.   Blood pressure 124/81, pulse 75, temperature 98.2 F (36.8 C), temperature source Oral, resp. rate 18, SpO2 97 %.   Final Clinical Impressions(s) / ED Diagnoses   Final diagnoses:  Upper respiratory tract infection, unspecified type    New Prescriptions Discharge Medication List as of 09/25/2016  6:38 PM    START taking these medications   Details  azithromycin (ZITHROMAX) 250 MG tablet Take 1 tablet (250 mg total) by mouth daily. Take first 2 tablets together, then 1 every day until finished. Do not fill until 6/29, Starting Fri 09/28/2016, Print       I personally performed the services described in this documentation, which was scribed in my presence. The recorded information has been reviewed and is accurate.     [place], Chase Picket, PA-C 09/25/16 1910    Linwood Dibbles, MD 09/25/16 [phone]

## 2016-09-25 NOTE — ED Triage Notes (Signed)
Pt c/o nasal congestion, yellow rhinorrhea, chest pressure, SOB, chills, cough, nausea, emesis with yellow mucus onset yesterday. No sick contact.

## 2016-12-27 ENCOUNTER — Emergency Department (HOSPITAL_COMMUNITY): Payer: No Typology Code available for payment source

## 2016-12-27 ENCOUNTER — Encounter (HOSPITAL_COMMUNITY): Payer: Self-pay | Admitting: Emergency Medicine

## 2016-12-27 ENCOUNTER — Emergency Department (HOSPITAL_COMMUNITY)
Admission: EM | Admit: 2016-12-27 | Discharge: 2016-12-28 | Disposition: A | Discharge status: Home / Self Care | Payer: No Typology Code available for payment source | Attending: Emergency Medicine | Admitting: Emergency Medicine

## 2016-12-27 DIAGNOSIS — S46811A Strain of other muscles, fascia and tendons at shoulder and upper arm level, right arm, initial encounter: Secondary | ICD-10-CM | POA: Diagnosis not present

## 2016-12-27 DIAGNOSIS — S86812A Strain of other muscle(s) and tendon(s) at lower leg level, left leg, initial encounter: Secondary | ICD-10-CM

## 2016-12-27 DIAGNOSIS — Y929 Unspecified place or not applicable: Secondary | ICD-10-CM | POA: Insufficient documentation

## 2016-12-27 DIAGNOSIS — Z79899 Other long term (current) drug therapy: Secondary | ICD-10-CM | POA: Insufficient documentation

## 2016-12-27 DIAGNOSIS — S39012A Strain of muscle, fascia and tendon of lower back, initial encounter: Secondary | ICD-10-CM | POA: Diagnosis not present

## 2016-12-27 DIAGNOSIS — Y999 Unspecified external cause status: Secondary | ICD-10-CM | POA: Insufficient documentation

## 2016-12-27 DIAGNOSIS — Y9389 Activity, other specified: Secondary | ICD-10-CM | POA: Diagnosis not present

## 2016-12-27 MED ORDER — KETOROLAC TROMETHAMINE 30 MG/ML IJ SOLN
30.0000 mg | Freq: Once | INTRAMUSCULAR | Status: AC
  Administered 2016-12-28: 30 mg via INTRAMUSCULAR
  Filled 2016-12-27: qty 1
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
## 2016-12-28 MED ORDER — CYCLOBENZAPRINE HCL 10 MG PO TABS: 10.0000 mg | ORAL_TABLET | Freq: Two times a day (BID) | ORAL | 0 refills | Status: DC | PRN

## 2016-12-28 MED ORDER — NAPROXEN 500 MG PO TABS: 500.0000 mg | ORAL_TABLET | Freq: Two times a day (BID) | ORAL | 0 refills | Status: DC

## 2016-12-28 NOTE — Discharge Instructions (Signed)
Naprosyn for pain and inflammation. Flexeril for muscle spasms. Follow up with family doctor as needed for recheck if not improving in 3-5 days

## 2017-10-29 ENCOUNTER — Other Ambulatory Visit: Payer: Self-pay

## 2017-10-29 ENCOUNTER — Emergency Department (HOSPITAL_BASED_OUTPATIENT_CLINIC_OR_DEPARTMENT_OTHER)
Admission: EM | Admit: 2017-10-29 | Discharge: 2017-10-30 | Disposition: A | Discharge status: Home / Self Care | Payer: Self-pay | Attending: Emergency Medicine | Admitting: Emergency Medicine

## 2017-10-29 ENCOUNTER — Emergency Department (HOSPITAL_BASED_OUTPATIENT_CLINIC_OR_DEPARTMENT_OTHER): Payer: Self-pay

## 2017-10-29 DIAGNOSIS — J181 Lobar pneumonia, unspecified organism: Secondary | ICD-10-CM | POA: Insufficient documentation

## 2017-10-29 DIAGNOSIS — F17210 Nicotine dependence, cigarettes, uncomplicated: Secondary | ICD-10-CM | POA: Insufficient documentation

## 2017-10-29 DIAGNOSIS — Z79899 Other long term (current) drug therapy: Secondary | ICD-10-CM | POA: Insufficient documentation

## 2017-10-29 DIAGNOSIS — J189 Pneumonia, unspecified organism: Secondary | ICD-10-CM

## 2017-10-29 MED ORDER — DEXAMETHASONE SODIUM PHOSPHATE 10 MG/ML IJ SOLN
10.0000 mg | Freq: Once | INTRAMUSCULAR | Status: AC
  Administered 2017-10-29: 10 mg via INTRAMUSCULAR
  Filled 2017-10-29: qty 1

## 2017-10-29 MED ORDER — ALBUTEROL SULFATE HFA 108 (90 BASE) MCG/ACT IN AERS
2.0000 | INHALATION_SPRAY | Freq: Once | RESPIRATORY_TRACT | Status: AC
Start: 2017-10-30 — End: 2017-10-30
  Administered 2017-10-30: 2 via RESPIRATORY_TRACT
  Filled 2017-10-29: qty 6.7

## 2017-10-29 NOTE — ED Notes (Signed)
Pt. Reports cold symptoms for approx. A month or 3 wks.  Pt. Has chest and nasal congestion with cough.  Pt. Reports feeling bad.  Reports blowing his nose also with brown and green mucus that smells bad.

## 2017-10-29 NOTE — ED Triage Notes (Signed)
Pt states he became short of breath at work earlier today. Also c/o of symptoms of nasal congestion and stomach pain. Pt has been coughing up yellow and green phelgm also. Unsure if he has had a fever

## 2017-10-30 MED ORDER — ACETAMINOPHEN 325 MG PO TABS
650.0000 mg | ORAL_TABLET | Freq: Once | ORAL | Status: AC
  Administered 2017-10-30: 650 mg via ORAL
  Filled 2017-10-30: qty 2

## 2017-10-30 MED ORDER — AZITHROMYCIN 250 MG PO TABS: 250.0000 mg | ORAL_TABLET | Freq: Every day | ORAL | 0 refills | Status: DC

## 2017-10-30 MED ORDER — PROMETHAZINE-DM 6.25-15 MG/5ML PO SYRP: 2.5000 mL | ORAL_SOLUTION | Freq: Four times a day (QID) | ORAL | 0 refills | Status: DC | PRN

## 2017-10-30 MED ORDER — FLUTICASONE PROPIONATE 50 MCG/ACT NA SUSP: 2.0000 | Freq: Every day | NASAL | 0 refills | Status: DC

## 2017-10-30 NOTE — ED Provider Notes (Signed)
[facility] EMERGENCY DEPARTMENT Provider Note   CSN: [id number] Arrival date & time: 10/29/17  2306     History   Chief Complaint Chief Complaint  Patient presents with  . Nasal Congestion  . Shortness of Breath    HPI Dylan Brooks is a 37 y.o. male.  HPI  This is a 37 year old male who presents with nasal congestion, cough, posttussive emesis.  Patient reports 1 week history of worsening shortness of breath and cough.  He reports cough productive of "white mucus."  He has not taken his temperature but has experienced chills.  He reports nasal congestion.  When asked if he is taking anything for his symptoms he states "I taken everything."  No known sick contacts.  He is a smoker.  Has not noted any abdominal pain, chest pain.  Denies lower extremity swelling.  No past medical history on file.  Patient Active Problem List   Diagnosis Date Noted  . Hemorrhoid 02/20/2013  . Abdominal pain, epigastric 02/20/2013  . BRBPR (bright red blood per rectum) 02/20/2013  . Orthostatic hypotension 02/19/2013  . Acute kidney injury (HCC) 02/19/2013  . Lower GI bleeding 02/19/2013    Past Surgical History:  Procedure Laterality Date  . NO PAST SURGERIES          Home Medications    Prior to Admission medications   Medication Sig Start Date End Date Taking? Authorizing Provider  amoxicillin-clavulanate (AUGMENTIN) 875-125 MG per tablet Take 1 tablet by mouth 2 (two) times daily. 12/01/13   Doris Cheadle, MD  azithromycin (ZITHROMAX) 250 MG tablet Take 1 tablet (250 mg total) by mouth daily. Take first 2 tablets together, then 1 every day until finished. 10/30/17   Horton, Mayer Masker, MD  benzonatate (TESSALON) 100 MG capsule Take 1 capsule (100 mg total) by mouth 3 (three) times daily as needed for cough. 09/25/16   Ward, Chase Picket, PA-C  cyclobenzaprine (FLEXERIL) 10 MG tablet Take 1 tablet (10 mg total) by mouth 2 (two) times daily as needed for muscle spasms.  12/28/16   Kirichenko, Tatyana, PA-C  dicyclomine (BENTYL) 20 MG tablet Take 1 tablet (20 mg total) by mouth 2 (two) times daily. 04/06/15   Kirichenko, Tatyana, PA-C  fluticasone (FLONASE) 50 MCG/ACT nasal spray Place 2 sprays into both nostrils daily. 10/30/17   Horton, Mayer Masker, MD  hydrocortisone (ANUSOL-HC) 25 MG suppository Place 1 suppository (25 mg total) rectally 2 (two) times daily. Patient not taking: Reported on 04/06/2015 02/25/13   Doris Cheadle, MD  metroNIDAZOLE (FLAGYL) 500 MG tablet Take 4 tablets (2,000 mg total) by mouth once. Patient not taking: Reported on 04/06/2015 12/01/13   Doris Cheadle, MD  naproxen (NAPROSYN) 500 MG tablet Take 1 tablet (500 mg total) by mouth 2 (two) times daily. 12/28/16   Kirichenko, Lemont Fillers, PA-C  nicotine (NICODERM CQ) 21 mg/24hr patch Place 1 patch (21 mg total) onto the skin daily. Patient not taking: Reported on 04/06/2015 02/25/13   Doris Cheadle, MD  omeprazole (PRILOSEC) 20 MG capsule Take 1 capsule (20 mg total) by mouth daily. Patient not taking: Reported on 04/06/2015 02/25/13   Doris Cheadle, MD  promethazine-dextromethorphan (PROMETHAZINE-DM) 6.25-15 MG/5ML syrup Take 2.5 mLs by mouth 4 (four) times daily as needed for cough. 10/30/17   Horton, Mayer Masker, MD  varenicline (CHANTIX STARTING MONTH PAK) 0.5 MG X 11 & 1 MG X 42 tablet Take one 0.5 mg tablet by mouth once daily for 3 days, then increase to one 0.5 mg tablet  twice daily for 4 days, then increase to one 1 mg tablet twice daily. Patient not taking: Reported on 04/06/2015 12/01/13   Doris Cheadle, MD    Family History Family History  Problem Relation Age of Onset  . Cancer Mother   . Hypertension Mother   . Cancer Maternal Grandfather   . Diabetes Paternal Grandfather   . Stroke Paternal Grandfather     Social History Social History   Tobacco Use  . Smoking status: Current Every Day Smoker    Packs/day: 1.00    Years: 14.00    Pack years: 14.00  Substance Use Topics  .  Alcohol use: No  . Drug use: Yes    Types: Marijuana     Allergies   Patient has no known allergies.   Review of Systems Review of Systems  Constitutional: Positive for chills. Negative for fever.  HENT: Positive for congestion and rhinorrhea.   Respiratory: Positive for cough and shortness of breath.   Cardiovascular: Negative for chest pain.  Gastrointestinal: Positive for nausea. Negative for abdominal pain, diarrhea and vomiting.  Genitourinary: Negative for dysuria.  Neurological: Positive for headaches.  All other systems reviewed and are negative.    Physical Exam Updated Vital Signs BP 132/85   Pulse 70   Temp 98.3 F (36.8 C) (Oral)   Ht 6' (1.829 m)   Wt 117.9 kg (260 lb)   SpO2 98%   BMI 35.26 kg/m   Physical Exam  Constitutional: He is oriented to person, place, and time. He appears well-developed and well-nourished.  Overweight, no acute distress  HENT:  Head: Normocephalic and atraumatic.  Mouth/Throat: Oropharynx is clear and moist. No oropharyngeal exudate.  Congestion noted  Eyes: Pupils are equal, round, and reactive to light.  Neck: Normal range of motion. Neck supple.  Cardiovascular: Normal rate, regular rhythm and normal heart sounds.  No murmur heard. Pulmonary/Chest: Effort normal and breath sounds normal. No respiratory distress. He has no wheezes.  Abdominal: Soft. Bowel sounds are normal. There is no tenderness. There is no rebound.  Musculoskeletal: He exhibits no edema.       Right lower leg: He exhibits no edema.       Left lower leg: He exhibits no edema.  Lymphadenopathy:    He has no cervical adenopathy.  Neurological: He is alert and oriented to person, place, and time.  Skin: Skin is warm and dry.  Psychiatric: He has a normal mood and affect.  Nursing note and vitals reviewed.    ED Treatments / Results  Labs (all labs ordered are listed, but only abnormal results are displayed) Labs Reviewed - No data to  display  EKG EKG Interpretation  Date/Time:  Tuesday October 29 2017 23:45:47 EDT Ventricular Rate:  64 PR Interval:    QRS Duration: 92 QT Interval:  419 QTC Calculation: 433 R Axis:   64 Text Interpretation:  Sinus rhythm Probable left atrial enlargement ST elev, probable normal early repol pattern Confirmed by Ross Marcus ([id number]) on 10/30/2017 12:20:37 AM   Radiology Dg Chest 2 View  Result Date: 10/29/2017 CLINICAL DATA:  Cough with shortness of breath EXAM: CHEST - 2 VIEW COMPARISON:  09/25/2016 FINDINGS: Minimal left lower lobe infiltrate. No pleural effusion. Normal heart size. No pneumothorax. IMPRESSION: Minimal left lower lobe infiltrate. Electronically Signed   By: Jasmine Pang M.D.   On: 10/29/2017 23:52    Procedures Procedures (including critical care time)  Medications Ordered in ED Medications  dexamethasone (DECADRON) injection 10  mg (10 mg Intramuscular Given 10/29/17 2341)  albuterol (PROVENTIL HFA;VENTOLIN HFA) 108 (90 Base) MCG/ACT inhaler 2 puff (2 puffs Inhalation Given 10/30/17 0010)     Initial Impression / Assessment and Plan / ED Course  I have reviewed the triage vital signs and the nursing notes.  Pertinent labs & imaging results that were available during my care of the patient were reviewed by me and considered in my medical decision making (see chart for details).     Presents with upper respiratory symptoms.  He is overall nontoxic-appearing and afebrile.  No respiratory distress.  O2 sats 98%.  Exam is fairly unremarkable.  Chest x-ray does show a likely developing left lower lobe infiltrate.  He is fairly low risk.  Candidate for azithromycin.  Additionally, will start on Flonase.  He was given an inhaler and Decadron given his smoking history.  Discussed with patient supportive measures and antibiotics.  He was given strict return precautions.  After history, exam, and medical workup I feel the patient has been appropriately medically  screened and is safe for discharge home. Pertinent diagnoses were discussed with the patient. Patient was given return precautions.   Final Clinical Impressions(s) / ED Diagnoses   Final diagnoses:  Community acquired pneumonia of left lower lobe of lung [facility](HCC)    ED Discharge Orders        Ordered    fluticasone (FLONASE) 50 MCG/ACT nasal spray  Daily     10/30/17 0020    promethazine-dextromethorphan (PROMETHAZINE-DM) 6.25-15 MG/5ML syrup  4 times daily PRN     10/30/17 0020    azithromycin (ZITHROMAX) 250 MG tablet  Daily     10/30/17 0021       Shon BatonHorton, Courtney F, MD 10/30/17 0025

## 2017-10-30 NOTE — Discharge Instructions (Signed)
You were seen today for upper respiratory symptoms.  It does appear that you may have a early pneumonia.  He will be started on antibiotics.  Take Flonase for nasal congestion and you will be given a cough medication.  If you develop fevers or worsening symptoms you should be reevaluated.

## 2018-05-11 ENCOUNTER — Emergency Department (HOSPITAL_BASED_OUTPATIENT_CLINIC_OR_DEPARTMENT_OTHER)
Admission: EM | Admit: 2018-05-11 | Discharge: 2018-05-11 | Disposition: A | Discharge status: Home / Self Care | Payer: Self-pay | Attending: Emergency Medicine | Admitting: Emergency Medicine

## 2018-05-11 ENCOUNTER — Emergency Department (HOSPITAL_BASED_OUTPATIENT_CLINIC_OR_DEPARTMENT_OTHER): Payer: Self-pay

## 2018-05-11 ENCOUNTER — Encounter (HOSPITAL_BASED_OUTPATIENT_CLINIC_OR_DEPARTMENT_OTHER): Payer: Self-pay | Admitting: Emergency Medicine

## 2018-05-11 ENCOUNTER — Other Ambulatory Visit: Payer: Self-pay

## 2018-05-11 DIAGNOSIS — F17200 Nicotine dependence, unspecified, uncomplicated: Secondary | ICD-10-CM | POA: Insufficient documentation

## 2018-05-11 DIAGNOSIS — J189 Pneumonia, unspecified organism: Secondary | ICD-10-CM | POA: Insufficient documentation

## 2018-05-11 DIAGNOSIS — Z79899 Other long term (current) drug therapy: Secondary | ICD-10-CM | POA: Insufficient documentation

## 2018-05-11 LAB — CBC WITH DIFFERENTIAL/PLATELET
ABS IMMATURE GRANULOCYTES: 0.06 10*3/uL (ref 0.00–0.07)
BASOS PCT: 0 %
Basophils Absolute: 0.1 10*3/uL (ref 0.0–0.1)
Eosinophils Absolute: 0 10*3/uL (ref 0.0–0.5)
Eosinophils Relative: 0 %
HEMATOCRIT: 42.1 % (ref 39.0–52.0)
HEMOGLOBIN: 13.1 g/dL (ref 13.0–17.0)
IMMATURE GRANULOCYTES: 0 %
Lymphocytes Relative: 10 %
Lymphs Abs: 1.5 10*3/uL (ref 0.7–4.0)
MCH: 25.6 pg — AB (ref 26.0–34.0)
MCHC: 31.1 g/dL (ref 30.0–36.0)
MCV: 82.2 fL (ref 80.0–100.0)
MONOS PCT: 16 %
Monocytes Absolute: 2.4 10*3/uL — ABNORMAL HIGH (ref 0.1–1.0)
NEUTROS ABS: 10.9 10*3/uL — AB (ref 1.7–7.7)
NEUTROS PCT: 74 %
Platelets: 228 10*3/uL (ref 150–400)
RBC: 5.12 MIL/uL (ref 4.22–5.81)
RDW: 13.9 % (ref 11.5–15.5)
WBC: 15 10*3/uL — ABNORMAL HIGH (ref 4.0–10.5)
nRBC: 0 % (ref 0.0–0.2)

## 2018-05-11 LAB — BASIC METABOLIC PANEL
Anion gap: 9 (ref 5–15)
BUN: 12 mg/dL (ref 6–20)
CHLORIDE: 101 mmol/L (ref 98–111)
CO2: 24 mmol/L (ref 22–32)
Calcium: 8.4 mg/dL — ABNORMAL LOW (ref 8.9–10.3)
Creatinine, Ser: 1.56 mg/dL — ABNORMAL HIGH (ref 0.61–1.24)
GFR calc Af Amer: >60 mL/min (ref >60)
GFR calc non Af Amer: 56 mL/min — ABNORMAL LOW (ref >60)
GLUCOSE: 119 mg/dL — AB (ref 70–99)
POTASSIUM: 3.8 mmol/L (ref 3.5–5.1)
SODIUM: 134 mmol/L — AB (ref 135–145)

## 2018-05-11 LAB — GROUP A STREP BY PCR: GROUP A STREP BY PCR: NOT DETECTED

## 2018-05-11 MED ORDER — NAPROXEN 250 MG PO TABS
500.0000 mg | ORAL_TABLET | Freq: Once | ORAL | Status: AC
  Administered 2018-05-11: 500 mg via ORAL
  Filled 2018-05-11: qty 2

## 2018-05-11 MED ORDER — SODIUM CHLORIDE 0.9 % IV SOLN
INTRAVENOUS | Status: DC | PRN
  Administered 2018-05-11: 09:00:00 via INTRAVENOUS

## 2018-05-11 MED ORDER — ONDANSETRON 8 MG PO TBDP: 8.0000 mg | ORAL_TABLET | Freq: Three times a day (TID) | ORAL | 0 refills | Status: DC | PRN

## 2018-05-11 MED ORDER — AZITHROMYCIN 500 MG IV SOLR
INTRAVENOUS | Status: AC
  Filled 2018-05-11: qty 500

## 2018-05-11 MED ORDER — AZITHROMYCIN 250 MG PO TABS: 250.0000 mg | ORAL_TABLET | Freq: Every day | ORAL | 0 refills | Status: DC

## 2018-05-11 MED ORDER — DEXAMETHASONE SODIUM PHOSPHATE 10 MG/ML IJ SOLN
10.0000 mg | Freq: Once | INTRAMUSCULAR | Status: AC
  Administered 2018-05-11: 10 mg via INTRAMUSCULAR
  Filled 2018-05-11: qty 1

## 2018-05-11 MED ORDER — ACETAMINOPHEN 325 MG PO TABS
650.0000 mg | ORAL_TABLET | Freq: Once | ORAL | Status: AC
  Administered 2018-05-11: 650 mg via ORAL

## 2018-05-11 MED ORDER — SODIUM CHLORIDE 0.9 % IV SOLN
500.0000 mg | INTRAVENOUS | Status: DC
  Administered 2018-05-11: 500 mg via INTRAVENOUS
  Filled 2018-05-11: qty 500

## 2018-05-11 MED ORDER — ACETAMINOPHEN ER 650 MG PO TBCR: 650.0000 mg | EXTENDED_RELEASE_TABLET | Freq: Three times a day (TID) | ORAL | 0 refills | Status: DC

## 2018-05-11 MED ORDER — IBUPROFEN 600 MG PO TABS: 600.0000 mg | ORAL_TABLET | Freq: Four times a day (QID) | ORAL | 0 refills | Status: DC | PRN

## 2018-05-11 MED ORDER — CEFTRIAXONE SODIUM 2 G IJ SOLR
2.0000 g | INTRAMUSCULAR | Status: DC
  Administered 2018-05-11: 2 g via INTRAVENOUS
  Filled 2018-05-11: qty 20

## 2018-05-11 MED ORDER — DOXYCYCLINE HYCLATE 100 MG PO CAPS: 100.0000 mg | ORAL_CAPSULE | Freq: Two times a day (BID) | ORAL | 0 refills | Status: DC

## 2018-05-11 MED ORDER — ACETAMINOPHEN 325 MG PO TABS
ORAL_TABLET | ORAL | Status: AC
  Filled 2018-05-11: qty 2

## 2018-05-11 MED ORDER — AMOXICILLIN 500 MG PO CAPS: 1000.0000 mg | ORAL_CAPSULE | Freq: Three times a day (TID) | ORAL | 0 refills | Status: AC

## 2018-05-11 MED ORDER — SODIUM CHLORIDE 0.9 % IV BOLUS
1000.0000 mL | Freq: Once | INTRAVENOUS | Status: AC
  Administered 2018-05-11: 1000 mL via INTRAVENOUS

## 2018-05-11 NOTE — ED Provider Notes (Addendum)
[facility] EMERGENCY DEPARTMENT Provider Note   CSN: [id number] Arrival date & time: 05/11/18  9211     History   Chief Complaint Chief Complaint  Patient presents with  . Sore Throat    HPI Dylan Brooks is a 38 y.o. male.  HPI  38 year old male comes in with chief complaint of sore throat, cough and chest discomfort.. Patient has no significant medical history.  He reports that over the past 5 days or so he has been having URI-like symptoms with congestion, cough, sore throat.  He denies any sick contacts.  Patient has been feeling hot and cold and has had some sweats.  He denies any underlying lung disease.  Patient does smoke half a pack a day.  He also has history of pneumonia.  Patient states that when he coughs he is having generalized chest discomfort.  There is no history of PE, DVT and patient denies any substance abuse.  History reviewed. No pertinent past medical history.  Patient Active Problem List   Diagnosis Date Noted  . Hemorrhoid 02/20/2013  . Abdominal pain, epigastric 02/20/2013  . BRBPR (bright red blood per rectum) 02/20/2013  . Orthostatic hypotension 02/19/2013  . Acute kidney injury (HCC) 02/19/2013  . Lower GI bleeding 02/19/2013    Past Surgical History:  Procedure Laterality Date  . NO PAST SURGERIES          Home Medications    Prior to Admission medications   Medication Sig Start Date End Date Taking? Authorizing Provider  acetaminophen (TYLENOL 8 HOUR) 650 MG CR tablet Take 1 tablet (650 mg total) by mouth every 8 (eight) hours. 05/11/18   Derwood Kaplan, MD  amoxicillin (AMOXIL) 500 MG capsule Take 2 capsules (1,000 mg total) by mouth 3 (three) times daily for 7 days. 05/11/18 05/18/18  Derwood Kaplan, MD  azithromycin (ZITHROMAX) 250 MG tablet Take 1 tablet (250 mg total) by mouth daily. Take 1 tablet daily starting 2/10 05/11/18   Derwood Kaplan, MD  benzonatate (TESSALON) 100 MG capsule Take 1 capsule (100 mg total) by  mouth 3 (three) times daily as needed for cough. 09/25/16   Ward, Chase Picket, PA-C  cyclobenzaprine (FLEXERIL) 10 MG tablet Take 1 tablet (10 mg total) by mouth 2 (two) times daily as needed for muscle spasms. 12/28/16   Kirichenko, Tatyana, PA-C  dicyclomine (BENTYL) 20 MG tablet Take 1 tablet (20 mg total) by mouth 2 (two) times daily. 04/06/15   Kirichenko, Tatyana, PA-C  fluticasone (FLONASE) 50 MCG/ACT nasal spray Place 2 sprays into both nostrils daily. 10/30/17   Horton, Mayer Masker, MD  hydrocortisone (ANUSOL-HC) 25 MG suppository Place 1 suppository (25 mg total) rectally 2 (two) times daily. Patient not taking: Reported on 04/06/2015 02/25/13   Doris Cheadle, MD  ibuprofen (ADVIL,MOTRIN) 600 MG tablet Take 1 tablet (600 mg total) by mouth every 6 (six) hours as needed. 05/11/18   Derwood Kaplan, MD  metroNIDAZOLE (FLAGYL) 500 MG tablet Take 4 tablets (2,000 mg total) by mouth once. Patient not taking: Reported on 04/06/2015 12/01/13   Doris Cheadle, MD  naproxen (NAPROSYN) 500 MG tablet Take 1 tablet (500 mg total) by mouth 2 (two) times daily. 12/28/16   Kirichenko, Lemont Fillers, PA-C  nicotine (NICODERM CQ) 21 mg/24hr patch Place 1 patch (21 mg total) onto the skin daily. Patient not taking: Reported on 04/06/2015 02/25/13   Doris Cheadle, MD  omeprazole (PRILOSEC) 20 MG capsule Take 1 capsule (20 mg total) by mouth daily. Patient not taking: Reported  on 04/06/2015 02/25/13   Doris CheadleAdvani, Deepak, MD  ondansetron (ZOFRAN ODT) 8 MG disintegrating tablet Take 1 tablet (8 mg total) by mouth every 8 (eight) hours as needed for nausea. 05/11/18   Derwood KaplanNanavati, Mirian Casco, MD  promethazine-dextromethorphan (PROMETHAZINE-DM) 6.25-15 MG/5ML syrup Take 2.5 mLs by mouth 4 (four) times daily as needed for cough. 10/30/17   Horton, Mayer Maskerourtney F, MD  varenicline (CHANTIX STARTING MONTH PAK) 0.5 MG X 11 & 1 MG X 42 tablet Take one 0.5 mg tablet by mouth once daily for 3 days, then increase to one 0.5 mg tablet twice daily for 4 days, then  increase to one 1 mg tablet twice daily. Patient not taking: Reported on 04/06/2015 12/01/13   Doris CheadleAdvani, Deepak, MD    Family History Family History  Problem Relation Age of Onset  . Cancer Mother   . Hypertension Mother   . Cancer Maternal Grandfather   . Diabetes Paternal Grandfather   . Stroke Paternal Grandfather     Social History Social History   Tobacco Use  . Smoking status: Current Every Day Smoker    Packs/day: 1.00    Years: 14.00    Pack years: 14.00  . Smokeless tobacco: Never Used  Substance Use Topics  . Alcohol use: No  . Drug use: Yes    Types: Marijuana     Allergies   Patient has no known allergies.   Review of Systems Review of Systems  Constitutional: Positive for activity change.  HENT: Positive for congestion and sore throat.   Respiratory: Positive for cough. Negative for shortness of breath.   Cardiovascular: Positive for chest pain.  Allergic/Immunologic: Negative for immunocompromised state.  All other systems reviewed and are negative.    Physical Exam Updated Vital Signs BP 126/74   Pulse 90   Temp (!) 101.3 F (38.5 C) (Oral)   Resp 20   SpO2 99%   Physical Exam Vitals signs and nursing note reviewed.  Constitutional:      Appearance: He is well-developed.  HENT:     Head: Atraumatic.     Nose: Congestion and rhinorrhea present.     Mouth/Throat:     Mouth: No oral lesions.     Pharynx: Posterior oropharyngeal erythema present. No oropharyngeal exudate or uvula swelling.     Tonsils: No tonsillar exudate or tonsillar abscesses.  Neck:     Musculoskeletal: Neck supple.  Cardiovascular:     Rate and Rhythm: Normal rate.  Pulmonary:     Effort: Pulmonary effort is normal. No respiratory distress.     Breath sounds: No wheezing.  Lymphadenopathy:     Cervical: Cervical adenopathy present.  Skin:    General: Skin is warm.  Neurological:     Mental Status: He is alert and oriented to person, place, and time.      ED  Treatments / Results  Labs (all labs ordered are listed, but only abnormal results are displayed) Labs Reviewed  CBC WITH DIFFERENTIAL/PLATELET - Abnormal; Notable for the following components:      Result Value   WBC 15.0 (*)    MCH 25.6 (*)    Neutro Abs 10.9 (*)    Monocytes Absolute 2.4 (*)    All other components within normal limits  BASIC METABOLIC PANEL - Abnormal; Notable for the following components:   Sodium 134 (*)    Glucose, Bld 119 (*)    Creatinine, Ser 1.56 (*)    Calcium 8.4 (*)    GFR calc non Af  Amer 56 (*)    All other components within normal limits  GROUP A STREP BY PCR    EKG None  Radiology Dg Chest 2 View  Result Date: 05/11/2018 CLINICAL DATA:  38 year old male with a history of cough congestion fever EXAM: CHEST - 2 VIEW COMPARISON:  10/29/2017 FINDINGS: Cardiomediastinal silhouette unchanged in size and contour. Patchy opacities in the hilar regions and at the left lung base. No pleural effusion or pneumothorax. No displaced fracture. No evidence of pulmonary vascular congestion IMPRESSION: Patchy opacities in the hilar regions and left lung base suggests multifocal pneumonia versus atelectasis. Electronically Signed   By: Gilmer MorJaime  Wagner D.O.   On: 05/11/2018 08:19    Procedures Procedures (including critical care time)   The patient was counseled on the dangers of tobacco use, and was advised to quit.  Reviewed strategies to maximize success, including removing cigarettes and smoking materials from environment and stress management.  Medications Ordered in ED Medications  cefTRIAXone (ROCEPHIN) 2 g in sodium chloride 0.9 % 100 mL IVPB ( Intravenous Stopped 05/11/18 0919)  azithromycin (ZITHROMAX) 500 mg in sodium chloride 0.9 % 250 mL IVPB (500 mg Intravenous New Bag/Given 05/11/18 0929)  azithromycin (ZITHROMAX) 500 MG injection (has no administration in time range)  0.9 %  sodium chloride infusion ( Intravenous New Bag/Given 05/11/18 0847)    dexamethasone (DECADRON) injection 10 mg (10 mg Intramuscular Given 05/11/18 0747)  naproxen (NAPROSYN) tablet 500 mg (500 mg Oral Given 05/11/18 0751)  sodium chloride 0.9 % bolus 1,000 mL (1,000 mLs Intravenous New Bag/Given 05/11/18 0851)  acetaminophen (TYLENOL) tablet 650 mg (650 mg Oral Given 05/11/18 0857)     Initial Impression / Assessment and Plan / ED Course  I have reviewed the triage vital signs and the nursing notes.  Pertinent labs & imaging results that were available during my care of the patient were reviewed by me and considered in my medical decision making (see chart for details).     38 year old male comes in with chief complaint of URI-like symptoms along with cough.  We ordered an x-ray because there were some diminished breath sounds in the lower bases.  X-ray showing multifocal pneumonia.  Patient does not have any underlying lung disease, cardiovascular problems and he is immunocompetent.  Hemodynamically patient is stable, noted to have mild tachycardia and low-grade fever.  Given that there is concerns for multifocal pneumonia we will give him IV antibiotics here.  Patient ambulated and he did not have any hypoxia.  He did spike a fever later on during the stay.  However patient is not toxic appearing and does not meet admissions criteria.  We will discharge him with antibiotics and strict ER return precautions.  Final Clinical Impressions(s) / ED Diagnoses   Final diagnoses:  Multifocal pneumonia    ED Discharge Orders         Ordered    ibuprofen (ADVIL,MOTRIN) 600 MG tablet  Every 6 hours PRN     05/11/18 0756    acetaminophen (TYLENOL 8 HOUR) 650 MG CR tablet  Every 8 hours     05/11/18 0756    ondansetron (ZOFRAN ODT) 8 MG disintegrating tablet  Every 8 hours PRN     05/11/18 0756    amoxicillin (AMOXIL) 500 MG capsule  3 times daily     05/11/18 0948    doxycycline (VIBRAMYCIN) 100 MG capsule  2 times daily,   Status:  Discontinued     05/11/18 [id number]  azithromycin (ZITHROMAX) 250 MG tablet  Daily     05/11/18 1009           Derwood Kaplan, MD 05/11/18 2956    Derwood Kaplan, MD 05/11/18 1009

## 2018-05-11 NOTE — Discharge Instructions (Signed)
We saw in the ER for cough, sore throat and upper respiratory infection-like symptoms. Your rapid strep test was negative for infection. However the x-ray is indicative of pneumonia.  We are discharging you home with antibiotics, please complete the entire course of the antibiotics even if you start feeling better.   Please return to the ER if your symptoms worsen; you have increased pain, fevers, chills, inability to keep any medications down, confusion. Otherwise see the outpatient doctor as requested.

## 2018-05-11 NOTE — ED Triage Notes (Signed)
Pt reports sore throat, cough, uri s/s x5 days.

## 2018-05-11 NOTE — ED Notes (Signed)
AMBULATING:  SpO2 96-97% HR 98-115bpm  Pt c/o feeling tired

## 2018-09-28 ENCOUNTER — Encounter (HOSPITAL_BASED_OUTPATIENT_CLINIC_OR_DEPARTMENT_OTHER): Payer: Self-pay | Admitting: *Deleted

## 2018-09-28 ENCOUNTER — Other Ambulatory Visit: Payer: Self-pay

## 2018-09-28 ENCOUNTER — Emergency Department (HOSPITAL_BASED_OUTPATIENT_CLINIC_OR_DEPARTMENT_OTHER): Payer: No Typology Code available for payment source

## 2018-09-28 ENCOUNTER — Emergency Department (HOSPITAL_BASED_OUTPATIENT_CLINIC_OR_DEPARTMENT_OTHER)
Admission: EM | Admit: 2018-09-28 | Discharge: 2018-09-28 | Disposition: A | Discharge status: Home / Self Care | Payer: No Typology Code available for payment source | Attending: Emergency Medicine | Admitting: Emergency Medicine

## 2018-09-28 DIAGNOSIS — F17210 Nicotine dependence, cigarettes, uncomplicated: Secondary | ICD-10-CM | POA: Diagnosis not present

## 2018-09-28 DIAGNOSIS — S40811A Abrasion of right upper arm, initial encounter: Secondary | ICD-10-CM | POA: Insufficient documentation

## 2018-09-28 DIAGNOSIS — T148XXA Other injury of unspecified body region, initial encounter: Secondary | ICD-10-CM

## 2018-09-28 DIAGNOSIS — Y998 Other external cause status: Secondary | ICD-10-CM | POA: Diagnosis not present

## 2018-09-28 DIAGNOSIS — Z79899 Other long term (current) drug therapy: Secondary | ICD-10-CM | POA: Diagnosis not present

## 2018-09-28 DIAGNOSIS — S52501A Unspecified fracture of the lower end of right radius, initial encounter for closed fracture: Secondary | ICD-10-CM | POA: Insufficient documentation

## 2018-09-28 DIAGNOSIS — Y92410 Unspecified street and highway as the place of occurrence of the external cause: Secondary | ICD-10-CM | POA: Diagnosis not present

## 2018-09-28 DIAGNOSIS — Y9389 Activity, other specified: Secondary | ICD-10-CM | POA: Insufficient documentation

## 2018-09-28 DIAGNOSIS — S59912A Unspecified injury of left forearm, initial encounter: Secondary | ICD-10-CM | POA: Diagnosis present

## 2018-09-28 HISTORY — DX: Rider (driver) (passenger) of other motorcycle injured in unspecified traffic accident, initial encounter: V29.99XA

## 2018-09-28 MED ORDER — HYDROCODONE-ACETAMINOPHEN 5-325 MG PO TABS
1.0000 | ORAL_TABLET | Freq: Once | ORAL | Status: AC
  Administered 2018-09-28: 1 via ORAL
  Filled 2018-09-28: qty 1

## 2018-09-28 MED ORDER — HYDROCODONE-ACETAMINOPHEN 5-325 MG PO TABS: 1.0000 | ORAL_TABLET | Freq: Four times a day (QID) | ORAL | 0 refills | Status: DC | PRN

## 2018-09-28 MED ORDER — IBUPROFEN 400 MG PO TABS
400.0000 mg | ORAL_TABLET | Freq: Once | ORAL | Status: AC
  Administered 2018-09-28: 400 mg via ORAL
  Filled 2018-09-28: qty 1

## 2018-09-28 NOTE — Discharge Instructions (Signed)
Do not lift anything with this hand and leave in splint and sling until you see Dr. Percell Miller tomorrow.

## 2018-09-28 NOTE — ED Notes (Signed)
In xray
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
## 2018-09-29 ENCOUNTER — Other Ambulatory Visit (HOSPITAL_COMMUNITY)
Admission: RE | Admit: 2018-09-29 | Discharge: 2018-09-29 | Disposition: A | Discharge status: Home / Self Care | Payer: HRSA Program | Source: Ambulatory Visit | Attending: Orthopedic Surgery | Admitting: Orthopedic Surgery

## 2018-09-29 ENCOUNTER — Encounter (HOSPITAL_BASED_OUTPATIENT_CLINIC_OR_DEPARTMENT_OTHER): Payer: Self-pay | Admitting: *Deleted

## 2018-09-29 DIAGNOSIS — Z1159 Encounter for screening for other viral diseases: Secondary | ICD-10-CM | POA: Diagnosis not present

## 2018-09-29 DIAGNOSIS — Z01812 Encounter for preprocedural laboratory examination: Secondary | ICD-10-CM | POA: Diagnosis present

## 2018-09-29 LAB — SARS CORONAVIRUS 2 (TAT 6-24 HRS): SARS Coronavirus 2: NEGATIVE

## 2018-09-30 ENCOUNTER — Encounter (HOSPITAL_BASED_OUTPATIENT_CLINIC_OR_DEPARTMENT_OTHER): Payer: Self-pay

## 2018-09-30 ENCOUNTER — Other Ambulatory Visit: Payer: Self-pay

## 2018-09-30 MED ORDER — SCOPOLAMINE 1 MG/3DAYS TD PT72
1.0000 | MEDICATED_PATCH | Freq: Once | TRANSDERMAL | Status: DC
  Filled 2018-09-30: qty 1

## 2018-09-30 MED ORDER — MIDAZOLAM HCL 2 MG/2ML IJ SOLN
1.0000 mg | INTRAMUSCULAR | Status: DC | PRN
  Filled 2018-09-30: qty 2

## 2018-09-30 MED ORDER — FENTANYL CITRATE (PF) 100 MCG/2ML IJ SOLN
50.0000 ug | INTRAMUSCULAR | Status: DC | PRN
  Filled 2018-09-30: qty 1

## 2018-09-30 MED ORDER — LACTATED RINGERS IV SOLN
INTRAVENOUS | Status: DC
  Filled 2018-09-30: qty 1000

## 2018-09-30 NOTE — Progress Notes (Signed)
Spoke with:  Gibson NPO:  No food after midnight/Clear liquids until 8:15AM DRINK/ DOS Arrival time: 0915 Labs: N/A (COVID 09/29/2018 IN Epic) AM medications: NONE Pre op orders: [place] home: Donny Pique (girlfriend) [phone]

## 2018-09-30 NOTE — Progress Notes (Signed)
SPOKE W/  Cyler     SCREENING SYMPTOMS OF COVID 19:   COUGH--NO  RUNNY NOSE--- NO  SORE THROAT---NO  NASAL CONGESTION----NO  SNEEZING----NO  SHORTNESS OF BREATH---NO  DIFFICULTY BREATHING---NO  TEMP >100.0 -----NO  UNEXPLAINED BODY ACHES------NO  CHILLS -------- NO  HEADACHES ---------NO  LOSS OF SMELL/ TASTE --------NO    HAVE YOU OR ANY FAMILY MEMBER TRAVELLED PAST 14 DAYS OUT OF THE   COUNTY---NO STATE----NO COUNTRY----NO  HAVE YOU OR ANY FAMILY MEMBER BEEN EXPOSED TO ANYONE WITH COVID 19? NO    
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
## 2018-10-01 NOTE — Anesthesia Preprocedure Evaluation (Addendum)
Anesthesia Evaluation  Patient identified by MRN, date of birth, ID band Patient awake    Reviewed: Allergy & Precautions, NPO status , Patient's Chart, lab work & pertinent test results  History of Anesthesia Complications Negative for: history of anesthetic complications  Airway Mallampati: II  TM Distance: >3 FB Neck ROM: Full    Dental no notable dental hx. (+) Teeth Intact, Dental Advisory Given,    Pulmonary Current Smoker,    Pulmonary exam normal        Cardiovascular negative cardio ROS Normal cardiovascular exam Rhythm:Regular Rate:Normal     Neuro/Psych negative neurological ROS     GI/Hepatic Neg liver ROS, GERD  Controlled,  Endo/Other  negative endocrine ROS  Renal/GU Renal InsufficiencyRenal disease     Musculoskeletal Left wrist fracture   Abdominal   Peds  Hematology negative hematology ROS (+)   Anesthesia Other Findings Day of surgery medications reviewed with the patient.  Reproductive/Obstetrics                           Anesthesia Physical Anesthesia Plan  ASA: II  Anesthesia Plan: MAC and Regional   Post-op Pain Management:  Regional for Post-op pain   Induction:   PONV Risk Score and Plan: 1 and Treatment may vary due to age or medical condition, Propofol infusion, Ondansetron and Dexamethasone  Airway Management Planned: Natural Airway and Simple Face Mask  Additional Equipment:   Intra-op Plan:   Post-operative Plan:   Informed Consent: I have reviewed the patients History and Physical, chart, labs and discussed the procedure including the risks, benefits and alternatives for the proposed anesthesia with the patient or authorized representative who has indicated his/her understanding and acceptance.     Dental advisory given  Plan Discussed with: CRNA  Anesthesia Plan Comments:        Anesthesia Quick Evaluation

## 2018-10-02 ENCOUNTER — Ambulatory Visit (HOSPITAL_BASED_OUTPATIENT_CLINIC_OR_DEPARTMENT_OTHER): Payer: No Typology Code available for payment source | Admitting: Anesthesiology

## 2018-10-02 ENCOUNTER — Encounter (HOSPITAL_BASED_OUTPATIENT_CLINIC_OR_DEPARTMENT_OTHER)
Admission: RE | Disposition: A | Discharge status: Home / Self Care | Payer: Self-pay | Source: Home / Self Care | Attending: Orthopedic Surgery

## 2018-10-02 ENCOUNTER — Other Ambulatory Visit: Payer: Self-pay

## 2018-10-02 ENCOUNTER — Ambulatory Visit (HOSPITAL_BASED_OUTPATIENT_CLINIC_OR_DEPARTMENT_OTHER)
Admission: RE | Admit: 2018-10-02 | Discharge: 2018-10-02 | Disposition: A | Discharge status: Home / Self Care | Payer: No Typology Code available for payment source | Attending: Orthopedic Surgery | Admitting: Orthopedic Surgery

## 2018-10-02 ENCOUNTER — Encounter (HOSPITAL_BASED_OUTPATIENT_CLINIC_OR_DEPARTMENT_OTHER): Payer: Self-pay | Admitting: *Deleted

## 2018-10-02 DIAGNOSIS — S52502A Unspecified fracture of the lower end of left radius, initial encounter for closed fracture: Secondary | ICD-10-CM

## 2018-10-02 DIAGNOSIS — K219 Gastro-esophageal reflux disease without esophagitis: Secondary | ICD-10-CM | POA: Diagnosis not present

## 2018-10-02 DIAGNOSIS — F17210 Nicotine dependence, cigarettes, uncomplicated: Secondary | ICD-10-CM | POA: Diagnosis not present

## 2018-10-02 DIAGNOSIS — S52502D Unspecified fracture of the lower end of left radius, subsequent encounter for closed fracture with routine healing: Secondary | ICD-10-CM | POA: Diagnosis not present

## 2018-10-02 HISTORY — PX: ORIF WRIST FRACTURE: SHX2133

## 2018-10-02 HISTORY — DX: Pneumonia, unspecified organism: J18.9

## 2018-10-02 HISTORY — DX: Anemia, unspecified: D64.9

## 2018-10-02 HISTORY — DX: Fracture of unspecified carpal bone, left wrist, initial encounter for closed fracture: S62.102A

## 2018-10-02 HISTORY — DX: Gastro-esophageal reflux disease without esophagitis: K21.9

## 2018-10-02 SURGERY — OPEN REDUCTION INTERNAL FIXATION (ORIF) WRIST FRACTURE
Anesthesia: Monitor Anesthesia Care | Site: Wrist | Laterality: Left

## 2018-10-02 MED ORDER — ACETAMINOPHEN 10 MG/ML IV SOLN
1000.0000 mg | Freq: Once | INTRAVENOUS | Status: DC | PRN
  Filled 2018-10-02: qty 100

## 2018-10-02 MED ORDER — ONDANSETRON HCL 4 MG/2ML IJ SOLN
INTRAMUSCULAR | Status: AC
  Filled 2018-10-02: qty 2

## 2018-10-02 MED ORDER — LIDOCAINE 2% (20 MG/ML) 5 ML SYRINGE
INTRAMUSCULAR | Status: DC | PRN
  Administered 2018-10-02: 25 mg via INTRAVENOUS

## 2018-10-02 MED ORDER — CEFAZOLIN SODIUM-DEXTROSE 2-4 GM/100ML-% IV SOLN
2.0000 g | INTRAVENOUS | Status: AC
  Administered 2018-10-02: 2 g via INTRAVENOUS
  Filled 2018-10-02: qty 100

## 2018-10-02 MED ORDER — ACETAMINOPHEN 500 MG PO TABS
ORAL_TABLET | ORAL | Status: AC
  Filled 2018-10-02: qty 2

## 2018-10-02 MED ORDER — CHLORHEXIDINE GLUCONATE 4 % EX LIQD
60.0000 mL | Freq: Once | CUTANEOUS | Status: DC
  Filled 2018-10-02: qty 118

## 2018-10-02 MED ORDER — LACTATED RINGERS IV SOLN
INTRAVENOUS | Status: DC
  Administered 2018-10-02 (×2): via INTRAVENOUS
  Filled 2018-10-02: qty 1000

## 2018-10-02 MED ORDER — FENTANYL CITRATE (PF) 100 MCG/2ML IJ SOLN
25.0000 ug | INTRAMUSCULAR | Status: DC | PRN
  Filled 2018-10-02: qty 1

## 2018-10-02 MED ORDER — PROPOFOL 10 MG/ML IV BOLUS
INTRAVENOUS | Status: AC
  Filled 2018-10-02: qty 40

## 2018-10-02 MED ORDER — ONDANSETRON HCL 4 MG PO TABS: 4.0000 mg | ORAL_TABLET | Freq: Three times a day (TID) | ORAL | 0 refills | Status: DC | PRN

## 2018-10-02 MED ORDER — FENTANYL CITRATE (PF) 100 MCG/2ML IJ SOLN
50.0000 ug | Freq: Once | INTRAMUSCULAR | Status: AC
  Administered 2018-10-02: 10:00:00 50 ug via INTRAVENOUS
  Filled 2018-10-02: qty 1

## 2018-10-02 MED ORDER — GABAPENTIN 300 MG PO CAPS
300.0000 mg | ORAL_CAPSULE | Freq: Once | ORAL | Status: DC
  Filled 2018-10-02: qty 1

## 2018-10-02 MED ORDER — DEXAMETHASONE SODIUM PHOSPHATE 10 MG/ML IJ SOLN
INTRAMUSCULAR | Status: AC
  Filled 2018-10-02: qty 1

## 2018-10-02 MED ORDER — ONDANSETRON HCL 4 MG/2ML IJ SOLN
INTRAMUSCULAR | Status: DC | PRN
  Administered 2018-10-02: 4 mg via INTRAVENOUS

## 2018-10-02 MED ORDER — PROMETHAZINE HCL 25 MG/ML IJ SOLN
6.2500 mg | INTRAMUSCULAR | Status: DC | PRN
  Filled 2018-10-02: qty 1

## 2018-10-02 MED ORDER — CELECOXIB 400 MG PO CAPS
400.0000 mg | ORAL_CAPSULE | Freq: Once | ORAL | Status: DC
  Filled 2018-10-02: qty 1

## 2018-10-02 MED ORDER — FENTANYL CITRATE (PF) 100 MCG/2ML IJ SOLN
INTRAMUSCULAR | Status: AC
  Filled 2018-10-02: qty 2

## 2018-10-02 MED ORDER — OXYCODONE HCL 5 MG/5ML PO SOLN
5.0000 mg | Freq: Once | ORAL | Status: DC | PRN
  Filled 2018-10-02: qty 5

## 2018-10-02 MED ORDER — OXYCODONE HCL 5 MG PO TABS: 5.0000 mg | ORAL_TABLET | ORAL | 0 refills | Status: AC | PRN

## 2018-10-02 MED ORDER — OXYCODONE HCL 5 MG PO TABS
5.0000 mg | ORAL_TABLET | Freq: Once | ORAL | Status: DC | PRN
  Filled 2018-10-02: qty 1

## 2018-10-02 MED ORDER — MIDAZOLAM HCL 2 MG/2ML IJ SOLN
INTRAMUSCULAR | Status: AC
  Filled 2018-10-02: qty 2

## 2018-10-02 MED ORDER — PROPOFOL 500 MG/50ML IV EMUL
INTRAVENOUS | Status: AC
  Filled 2018-10-02: qty 50

## 2018-10-02 MED ORDER — ACETAMINOPHEN 500 MG PO TABS: 1000.0000 mg | ORAL_TABLET | Freq: Three times a day (TID) | ORAL | 0 refills | Status: AC

## 2018-10-02 MED ORDER — GABAPENTIN 300 MG PO CAPS: 300.0000 mg | ORAL_CAPSULE | Freq: Two times a day (BID) | ORAL | 0 refills | Status: DC

## 2018-10-02 MED ORDER — MIDAZOLAM HCL 2 MG/2ML IJ SOLN
INTRAMUSCULAR | Status: DC | PRN
  Administered 2018-10-02: 2 mg via INTRAVENOUS

## 2018-10-02 MED ORDER — ACETAMINOPHEN 500 MG PO TABS
1000.0000 mg | ORAL_TABLET | Freq: Once | ORAL | Status: AC
  Administered 2018-10-02: 11:00:00 1000 mg via ORAL
  Filled 2018-10-02: qty 2

## 2018-10-02 MED ORDER — LIDOCAINE 2% (20 MG/ML) 5 ML SYRINGE
INTRAMUSCULAR | Status: AC
  Filled 2018-10-02: qty 5

## 2018-10-02 MED ORDER — CEFAZOLIN SODIUM 1 G IJ SOLR
INTRAMUSCULAR | Status: AC
  Filled 2018-10-02: qty 20

## 2018-10-02 MED ORDER — MIDAZOLAM HCL 2 MG/2ML IJ SOLN
2.0000 mg | Freq: Once | INTRAMUSCULAR | Status: AC
  Administered 2018-10-02: 2 mg via INTRAVENOUS
  Filled 2018-10-02: qty 2

## 2018-10-02 MED ORDER — BUPIVACAINE-EPINEPHRINE (PF) 0.5% -1:200000 IJ SOLN
INTRAMUSCULAR | Status: DC | PRN
  Administered 2018-10-02: 30 mL via PERINEURAL

## 2018-10-02 MED ORDER — PROPOFOL 500 MG/50ML IV EMUL
INTRAVENOUS | Status: DC | PRN
  Administered 2018-10-02: 200 ug/kg/min via INTRAVENOUS

## 2018-10-02 MED ORDER — LACTATED RINGERS IV SOLN
INTRAVENOUS | Status: DC
  Administered 2018-10-02: 11:00:00 via INTRAVENOUS
  Filled 2018-10-02: qty 1000

## 2018-10-02 SURGICAL SUPPLY — 90 items
2.2mm K-wire ×2 IMPLANT
BANDAGE ACE 4X5 VEL STRL LF (GAUZE/BANDAGES/DRESSINGS) ×3 IMPLANT
BIT DRILL 2.2 SS TIBIAL (BIT) ×2 IMPLANT
BLADE CLIPPER SENSICLIP SURGIC (BLADE) IMPLANT
BLADE SURG 15 STRL LF DISP TIS (BLADE) ×1 IMPLANT
BLADE SURG 15 STRL SS (BLADE) ×3
BNDG CMPR 9X4 STRL LF SNTH (GAUZE/BANDAGES/DRESSINGS) ×1
BNDG COHESIVE 4X5 TAN STRL (GAUZE/BANDAGES/DRESSINGS) ×3 IMPLANT
BNDG ESMARK 4X9 LF (GAUZE/BANDAGES/DRESSINGS) ×3 IMPLANT
CHLORAPREP W/TINT 26ML (MISCELLANEOUS) ×3 IMPLANT
CLOSURE STERI-STRIP 1/2X4 (GAUZE/BANDAGES/DRESSINGS)
CLSR STERI-STRIP ANTIMIC 1/2X4 (GAUZE/BANDAGES/DRESSINGS) IMPLANT
CORD BIPOLAR FORCEPS 12FT (ELECTRODE) ×3 IMPLANT
COVER BACK TABLE 60X90IN (DRAPES) ×2 IMPLANT
COVER WAND RF STERILE (DRAPES) IMPLANT
CUFF TOURN SGL QUICK 18X4 (TOURNIQUET CUFF) IMPLANT
CUFF TOURN SGL QUICK 24 (TOURNIQUET CUFF) ×3
CUFF TRNQT CYL 24X4X16.5-23 (TOURNIQUET CUFF) IMPLANT
DRAPE EXTREMITY T 121X128X90 (DISPOSABLE) ×2 IMPLANT
DRAPE IMP U-DRAPE 54X76 (DRAPES) ×3 IMPLANT
DRAPE OEC MINIVIEW 54X84 (DRAPES) ×3 IMPLANT
DRAPE U-SHAPE 47X51 STRL (DRAPES) ×3 IMPLANT
DRAPE U-SHAPE 76X120 STRL (DRAPES) ×2 IMPLANT
DRSG ADAPTIC 3X8 NADH LF (GAUZE/BANDAGES/DRESSINGS) ×2 IMPLANT
DRSG EMULSION OIL 3X3 NADH (GAUZE/BANDAGES/DRESSINGS) ×3 IMPLANT
ELECT REM PT RETURN 9FT ADLT (ELECTROSURGICAL) ×3
ELECTRODE REM PT RTRN 9FT ADLT (ELECTROSURGICAL) ×1 IMPLANT
GAUZE SPONGE 4X4 12PLY STRL (GAUZE/BANDAGES/DRESSINGS) ×3 IMPLANT
GAUZE SPONGE 4X4 12PLY STRL LF (GAUZE/BANDAGES/DRESSINGS) ×2 IMPLANT
GAUZE XEROFORM 1X8 LF (GAUZE/BANDAGES/DRESSINGS) ×1 IMPLANT
GLOVE BIO SURGEON STRL SZ7.5 (GLOVE) ×6 IMPLANT
GLOVE BIOGEL PI IND STRL 8 (GLOVE) ×2 IMPLANT
GLOVE BIOGEL PI INDICATOR 8 (GLOVE) ×4
GOWN STRL REUS W/TWL XL LVL3 (GOWN DISPOSABLE) ×6 IMPLANT
K-WIRE 1.6 (WIRE) ×6
K-WIRE FX5X1.6XNS BN SS (WIRE) ×2
KWIRE FX5X1.6XNS BN SS (WIRE) IMPLANT
NDL HYPO 25X1 1.5 SAFETY (NEEDLE) IMPLANT
NEEDLE HYPO 25X1 1.5 SAFETY (NEEDLE) IMPLANT
NS IRRIG 1000ML POUR BTL (IV SOLUTION) ×3 IMPLANT
PACK ARTHROSCOPY DSU (CUSTOM PROCEDURE TRAY) ×3 IMPLANT
PACK BASIN DAY SURGERY FS (CUSTOM PROCEDURE TRAY) ×3 IMPLANT
PAD CAST 4YDX4 CTTN HI CHSV (CAST SUPPLIES) ×1 IMPLANT
PADDING CAST ABS 4INX4YD NS (CAST SUPPLIES)
PADDING CAST ABS COTTON 4X4 ST (CAST SUPPLIES) ×1 IMPLANT
PADDING CAST COTTON 4X4 STRL (CAST SUPPLIES) ×3
PASSER SUT SWANSON 36MM LOOP (INSTRUMENTS) IMPLANT
PEG LOCKING SMOOTH 2.2X18 (Peg) ×6 IMPLANT
PEG LOCKING SMOOTH 2.2X20 (Screw) ×8 IMPLANT
PENCIL BUTTON HOLSTER BLD 10FT (ELECTRODE) ×3 IMPLANT
PLATE STANDARD DVR LEFT (Plate) ×3 IMPLANT
PLATE STD DVR LT 24X51 (Plate) IMPLANT
SCREW LOCK 14X2.7X 3 LD TPR (Screw) IMPLANT
SCREW LOCK 18X2.7X 3 LD TPR (Screw) IMPLANT
SCREW LOCK 20X2.7X 3 LD TPR (Screw) IMPLANT
SCREW LOCKING 2.7X14 (Screw) ×3 IMPLANT
SCREW LOCKING 2.7X18 (Screw) ×3 IMPLANT
SCREW LOCKING 2.7X20MM (Screw) ×3 IMPLANT
SCREW NONLOCK 2.7X20MM (Screw) ×2 IMPLANT
SLING ARM FOAM STRAP LRG (SOFTGOODS) ×1 IMPLANT
SLING ARM FOAM STRAP XLG (SOFTGOODS) ×2 IMPLANT
SLING ARM IMMOBILIZER LRG (SOFTGOODS) IMPLANT
SLING ARM IMMOBILIZER MED (SOFTGOODS) IMPLANT
SLING ARM MED ADULT FOAM STRAP (SOFTGOODS) IMPLANT
SLING ARM XL FOAM STRAP (SOFTGOODS) ×2 IMPLANT
SPLINT FAST PLASTER 5X30 (CAST SUPPLIES) ×2
SPLINT PLASTER CAST FAST 5X30 (CAST SUPPLIES) ×10 IMPLANT
SPONGE LAP 18X18 RF (DISPOSABLE) ×2 IMPLANT
STAPLER VISISTAT 35W (STAPLE) IMPLANT
SUCTION FRAZIER HANDLE 10FR (MISCELLANEOUS) ×2
SUCTION TUBE FRAZIER 10FR DISP (MISCELLANEOUS) ×1 IMPLANT
SUT ETHILON 3 0 PS 1 (SUTURE) ×2 IMPLANT
SUT FIBERWIRE #2 38 T-5 BLUE (SUTURE)
SUT MNCRL AB 4-0 PS2 18 (SUTURE) IMPLANT
SUT PROLENE 3 0 PS 2 (SUTURE) IMPLANT
SUT VIC AB 0 CT1 27 (SUTURE) ×3
SUT VIC AB 0 CT1 27XBRD ANBCTR (SUTURE) ×1 IMPLANT
SUT VIC AB 0 SH 27 (SUTURE) ×3 IMPLANT
SUT VIC AB 2-0 SH 27 (SUTURE) ×3
SUT VIC AB 2-0 SH 27XBRD (SUTURE) ×1 IMPLANT
SUT VIC AB 3-0 SH 27 (SUTURE)
SUT VIC AB 3-0 SH 27X BRD (SUTURE) IMPLANT
SUTURE FIBERWR #2 38 T-5 BLUE (SUTURE) IMPLANT
SYR BULB 3OZ (MISCELLANEOUS) ×3 IMPLANT
SYR CONTROL 10ML LL (SYRINGE) IMPLANT
TOWEL OR 17X26 10 PK STRL BLUE (TOWEL DISPOSABLE) ×3 IMPLANT
TUBE CONNECTING 12'X1/4 (SUCTIONS) ×1
TUBE CONNECTING 12X1/4 (SUCTIONS) ×2 IMPLANT
UNDERPAD 30X30 (UNDERPADS AND DIAPERS) ×3 IMPLANT
YANKAUER SUCT BULB TIP NO VENT (SUCTIONS) ×3 IMPLANT

## 2018-10-02 NOTE — Progress Notes (Signed)
Assisted Dr. Howze with left, ultrasound guided, supraclavicular block. Side rails up, monitors on throughout procedure. See vital signs in flow sheet. Tolerated Procedure well. 

## 2018-10-02 NOTE — Discharge Instructions (Signed)
°  Post Anesthesia Home Care Instructions  Activity: Get plenty of rest for the remainder of the day. A responsible individual must stay with you for 24 hours following the procedure.  For the next 24 hours, DO NOT: -Drive a car -Paediatric nurse -Drink alcoholic beverages -Take any medication unless instructed by your physician -Make any legal decisions or sign important papers.  Meals: Start with liquid foods such as gelatin or soup. Progress to regular foods as tolerated. Avoid greasy, spicy, heavy foods. If nausea and/or vomiting occur, drink only clear liquids until the nausea and/or vomiting subsides. Call your physician if vomiting continues.  Special Instructions/Symptoms: Your throat may feel dry or sore from the anesthesia or the breathing tube placed in your throat during surgery. If this causes discomfort, gargle with warm salt water. The discomfort should disappear within 24 hours.  If you had a scopolamine patch placed behind your ear for the management of post- operative nausea and/or vomiting:  1. The medication in the patch is effective for 72 hours, after which it should be removed.  Wrap patch in a tissue and discard in the trash. Wash hands thoroughly with soap and water. 2. You may remove the patch earlier than 72 hours if you experience unpleasant side effects which may include dry mouth, dizziness or visual disturbances. 3. Avoid touching the patch. Wash your hands with soap and water after contact with the patch.    Keep wrist elevated with ice as much as possible to reduce pain and swelling.  Diet: As you were doing prior to hospitalization   Shower:  You have a splint on, leave the splint in place and keep the splint dry with a plastic bag.  Dressing:  You have a splint - leave the splint in place and we will change your bandages during your first follow-up appointment.    Activity:  Increase activity slowly as tolerated, but follow the weight bearing  instructions below.  The rules on driving is that you can not be taking narcotics while you drive, and you must feel in control of the vehicle.    Weight Bearing:  Non weight bearing affected wrist.  Sling for comfort.   To prevent constipation: you may use a stool softener such as -  Colace (over the counter) 100 mg by mouth twice a day  Drink plenty of fluids (prune juice may be helpful) and high fiber foods Miralax (over the counter) for constipation as needed.    Itching:  If you experience itching with your medications, try taking only a single pain pill, or even half a pain pill at a time.  You can also use benadryl over the counter for itching or also to help with sleep.   Precautions:  If you experience chest pain or shortness of breath - call 911 immediately for transfer to the hospital emergency department!!  If you develop a fever greater that 101 F, purulent drainage from wound, increased redness or drainage from wound, or calf pain -- Call the office at [phone]                                         Follow- Up Appointment:  Please call for an appointment to be seen in 2 weeks [place] - [phone]

## 2018-10-02 NOTE — Anesthesia Procedure Notes (Addendum)
Anesthesia Regional Block: Supraclavicular block   Pre-Anesthetic Checklist: ,, timeout performed, Correct Patient, Correct Site, Correct Laterality, Correct Procedure, Correct Position, site marked, Risks and benefits discussed, pre-op evaluation,  At surgeon's request and post-op pain management  Laterality: Left  Prep: Maximum Sterile Barrier Precautions used, chloraprep       Needles:  Injection technique: Single-shot  Needle Type: Echogenic Stimulator Needle     Needle Length: 9cm  Needle Gauge: 22     Additional Needles:   Procedures:,,,, ultrasound used (permanent image in chart),,,,  Narrative:  Start time: 10/02/2018 10:23 AM End time: 10/02/2018 10:25 AM Injection made incrementally with aspirations every 5 mL.  Performed by: Personally  Anesthesiologist: Brennan Bailey, MD  Additional Notes: Risks, benefits, and alternative discussed. Patient gave consent for procedure. Patient prepped and draped in sterile fashion. Sedation administered, patient remains easily responsive to voice. Relevant anatomy identified with ultrasound guidance. Local anesthetic given in 5cc increments with no signs or symptoms of intravascular injection. No pain or paraesthesias with injection. Patient monitored throughout procedure with signs of LAST or immediate complications. Tolerated well. Ultrasound image placed in chart.  Tawny Asal, MD

## 2018-10-02 NOTE — Anesthesia Procedure Notes (Signed)
Procedure Name: MAC Date/Time: 10/02/2018 11:00 AM Performed by: Wanita Chamberlain, CRNA Pre-anesthesia Checklist: Patient identified, Emergency Drugs available, Suction available, Patient being monitored and Timeout performed Patient Re-evaluated:Patient Re-evaluated prior to induction Oxygen Delivery Method: Nasal cannula Induction Type: IV induction Placement Confirmation: CO2 detector,  breath sounds checked- equal and bilateral and positive ETCO2 Dental Injury: Teeth and Oropharynx as per pre-operative assessment

## 2018-10-02 NOTE — Transfer of Care (Signed)
Immediate Anesthesia Transfer of Care Note  Patient: Dylan Brooks  Procedure(s) Performed: OPEN REDUCTION INTERNAL FIXATION (ORIF) WRIST FRACTURE (Left Wrist)  Patient Location: PACU  Anesthesia Type:MAC  Level of Consciousness: awake, alert , oriented and patient cooperative  Airway & Oxygen Therapy: Patient Spontanous Breathing and Patient connected to nasal cannula oxygen  Post-op Assessment: Report given to RN and Post -op Vital signs reviewed and stable  Post vital signs: Reviewed and stable  Last Vitals:  Vitals Value Taken Time  BP    Temp    Pulse 71 10/02/18 1209  Resp 21 10/02/18 1209  SpO2 100 % 10/02/18 1209  Vitals shown include unvalidated device data.  Last Pain:  Vitals:   10/02/18 1107  TempSrc:   PainSc: 8       Patients Stated Pain Goal: 7 (33/35/45 6256)  Complications: No apparent anesthesia complications

## 2018-10-02 NOTE — Anesthesia Postprocedure Evaluation (Signed)
Anesthesia Post Note  Patient: Traver Meckes  Procedure(s) Performed: OPEN REDUCTION INTERNAL FIXATION (ORIF) WRIST FRACTURE (Left Wrist)     Patient location during evaluation: PACU Anesthesia Type: Regional Level of consciousness: awake and alert Pain management: pain level controlled Vital Signs Assessment: post-procedure vital signs reviewed and stable Respiratory status: spontaneous breathing, nonlabored ventilation and respiratory function stable Cardiovascular status: blood pressure returned to baseline and stable Postop Assessment: no apparent nausea or vomiting Anesthetic complications: no    Last Vitals:  Vitals:   10/02/18 1300 10/02/18 1315  BP: 115/65 105/70  Pulse: (!) 54 (!) 55  Resp: 15 17  Temp:    SpO2: 92% 92%    Last Pain:  Vitals:   10/02/18 1315  TempSrc:   PainSc: 0-No pain                 Brennan Bailey

## 2018-10-02 NOTE — Interval H&P Note (Signed)
I participated in the care of this patient and agree with the above history, physical and evaluation. I performed a review of the history and a physical exam as detailed   Leshea Jaggers Daniel Rosario Duey MD  

## 2018-10-06 ENCOUNTER — Encounter (HOSPITAL_BASED_OUTPATIENT_CLINIC_OR_DEPARTMENT_OTHER): Payer: Self-pay | Admitting: Orthopedic Surgery

## 2018-10-07 ENCOUNTER — Encounter (HOSPITAL_BASED_OUTPATIENT_CLINIC_OR_DEPARTMENT_OTHER): Payer: Self-pay | Admitting: Orthopedic Surgery

## 2018-10-07 NOTE — Addendum Note (Signed)
Addendum  created 10/07/18 0901 by Brennan Bailey, MD   Clinical Note Signed, Intraprocedure Blocks edited

## 2018-10-07 NOTE — Op Note (Signed)
10/02/2018  7:22 AM  PATIENT:  Dylan Brooks    PRE-OPERATIVE DIAGNOSIS:  LEFT WRIST FRACTURE  POST-OPERATIVE DIAGNOSIS:  Same  PROCEDURE:  OPEN REDUCTION INTERNAL FIXATION (ORIF) WRIST FRACTURE  SURGEON:  Renette Butters, MD  ASSISTANT: Roxan Hockey, PA-C, he was present and scrubbed throughout the case, critical for completion in a timely fashion, and for retraction, instrumentation, and closure.   ANESTHESIA:   gen  PREOPERATIVE INDICATIONS:  [name] is a  39 y.o. male with a diagnosis of LEFT WRIST FRACTURE who failed conservative measures and elected for surgical management.    The risks benefits and alternatives were discussed with the patient preoperatively including but not limited to the risks of infection, bleeding, nerve injury, cardiopulmonary complications, the need for revision surgery, among others, and the patient was willing to proceed.  OPERATIVE IMPLANTS: DVR plate  OPERATIVE FINDINGS: unstable fracture  BLOOD LOSS: min  COMPLICATIONS: none  TOURNIQUET TIME: 89min  OPERATIVE PROCEDURE:  Patient was identified in the preoperative holding area and site was marked by me He was transported to the operating theater and placed on the table in supine position taking care to pad all bony prominences. After a preincinduction time out anesthesia was induced. The left upper extremity was prepped and draped in normal sterile fashion and a pre-incision timeout was performed. He received ancef for preoperative antibiotics.   I made a 5 cm incision centered over her FCR tendon and dissected down carefully to the level of the flexor tendon sheath and incise this longitudinally and retracted the FCR radially and incised the dorsal aspect of the sheath.   I bluntly dissected the FPL muscle belly away from the brachioradialis and then sharply incised the pronator tendon from the distal radius and from the wrist capsule. I Elevated this off the bone the fractures visible.    I released the brachioradialis from its insertion. I then debrided the fracture and performed a manual reduction.   I selected a plate and I placed it on the bone. I pinned it into place and was happy on multiple radiographic views with it's placement. I placed a shaft screw and used the plate to reduce the volar fracture I then fixed the plate distally with the locking pegs. I confirmed no articular penetration with the pegs and that none were prominent dorsally.   I then placed remaining shaft screws.  I was happy with the final fluoro xrays    I thoroughly irrigated the wound and closed the pronator over top of the plate and then closed the skin in layers with absorbable stitch. Sterile dressing was applied using the PACU in stable condition.  POST OPERATIVE PLAN: NWB, Splint full time. Ambulate for DVT px.

## 2019-01-12 ENCOUNTER — Emergency Department (HOSPITAL_BASED_OUTPATIENT_CLINIC_OR_DEPARTMENT_OTHER): Payer: Self-pay

## 2019-01-12 ENCOUNTER — Encounter (HOSPITAL_BASED_OUTPATIENT_CLINIC_OR_DEPARTMENT_OTHER): Payer: Self-pay | Admitting: Emergency Medicine

## 2019-01-12 ENCOUNTER — Other Ambulatory Visit: Payer: Self-pay

## 2019-01-12 ENCOUNTER — Emergency Department (HOSPITAL_BASED_OUTPATIENT_CLINIC_OR_DEPARTMENT_OTHER)
Admission: EM | Admit: 2019-01-12 | Discharge: 2019-01-12 | Disposition: A | Discharge status: Home / Self Care | Payer: Self-pay | Attending: Emergency Medicine | Admitting: Emergency Medicine

## 2019-01-12 DIAGNOSIS — R05 Cough: Secondary | ICD-10-CM

## 2019-01-12 DIAGNOSIS — F17210 Nicotine dependence, cigarettes, uncomplicated: Secondary | ICD-10-CM | POA: Insufficient documentation

## 2019-01-12 DIAGNOSIS — J189 Pneumonia, unspecified organism: Secondary | ICD-10-CM | POA: Insufficient documentation

## 2019-01-12 DIAGNOSIS — R062 Wheezing: Secondary | ICD-10-CM | POA: Insufficient documentation

## 2019-01-12 DIAGNOSIS — R059 Cough, unspecified: Secondary | ICD-10-CM

## 2019-01-12 LAB — COMPREHENSIVE METABOLIC PANEL
ALT: 19 U/L (ref 0–44)
AST: 17 U/L (ref 15–41)
Albumin: 3.7 g/dL (ref 3.5–5.0)
Alkaline Phosphatase: 50 U/L (ref 38–126)
Anion gap: 9 (ref 5–15)
BUN: 10 mg/dL (ref 6–20)
CO2: 25 mmol/L (ref 22–32)
Calcium: 8.7 mg/dL — ABNORMAL LOW (ref 8.9–10.3)
Chloride: 106 mmol/L (ref 98–111)
Creatinine, Ser: 1.43 mg/dL — ABNORMAL HIGH (ref 0.61–1.24)
GFR calc Af Amer: >60 mL/min (ref >60)
GFR calc non Af Amer: >60 mL/min (ref >60)
Glucose, Bld: 106 mg/dL — ABNORMAL HIGH (ref 70–99)
Potassium: 3.8 mmol/L (ref 3.5–5.1)
Sodium: 140 mmol/L (ref 135–145)
Total Bilirubin: 0.4 mg/dL (ref 0.3–1.2)
Total Protein: 6.8 g/dL (ref 6.5–8.1)

## 2019-01-12 LAB — CBC WITH DIFFERENTIAL/PLATELET
Abs Immature Granulocytes: 0.03 10*3/uL (ref 0.00–0.07)
Basophils Absolute: 0 10*3/uL (ref 0.0–0.1)
Basophils Relative: 0 %
Eosinophils Absolute: 0.2 10*3/uL (ref 0.0–0.5)
Eosinophils Relative: 2 %
HCT: 41.8 % (ref 39.0–52.0)
Hemoglobin: 13.3 g/dL (ref 13.0–17.0)
Immature Granulocytes: 0 %
Lymphocytes Relative: 16 %
Lymphs Abs: 1.7 10*3/uL (ref 0.7–4.0)
MCH: 26.8 pg (ref 26.0–34.0)
MCHC: 31.8 g/dL (ref 30.0–36.0)
MCV: 84.1 fL (ref 80.0–100.0)
Monocytes Absolute: 1 10*3/uL (ref 0.1–1.0)
Monocytes Relative: 10 %
Neutro Abs: 7.5 10*3/uL (ref 1.7–7.7)
Neutrophils Relative %: 72 %
Platelets: 249 10*3/uL (ref 150–400)
RBC: 4.97 MIL/uL (ref 4.22–5.81)
RDW: 14.8 % (ref 11.5–15.5)
WBC: 10.5 10*3/uL (ref 4.0–10.5)
nRBC: 0 % (ref 0.0–0.2)

## 2019-01-12 LAB — BRAIN NATRIURETIC PEPTIDE: B Natriuretic Peptide: 32.4 pg/mL (ref 0.0–100.0)

## 2019-01-12 MED ORDER — DOXYCYCLINE HYCLATE 100 MG PO CAPS: 100.0000 mg | ORAL_CAPSULE | Freq: Two times a day (BID) | ORAL | 0 refills | Status: DC

## 2019-01-12 MED ORDER — ALBUTEROL SULFATE HFA 108 (90 BASE) MCG/ACT IN AERS
INHALATION_SPRAY | RESPIRATORY_TRACT | Status: AC
  Administered 2019-01-12: 09:00:00 4
  Filled 2019-01-12: qty 6.7

## 2019-01-12 NOTE — ED Notes (Signed)
ED Provider at bedside. 

## 2019-01-12 NOTE — ED Triage Notes (Signed)
Cough since last night
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
## 2019-01-12 NOTE — Discharge Instructions (Signed)
Your chest xray showed concern for an early pneumonia Please pick up antibiotic and take as prescribed The remainder of your bloodwork was reassuring Please follow up with your PCP in 2 weeks time for reevaluation to ensure pneumonia is improving

## 2019-01-12 NOTE — ED Notes (Signed)
Pt verbalized understanding of dc instructions.

## 2019-06-14 ENCOUNTER — Emergency Department (HOSPITAL_BASED_OUTPATIENT_CLINIC_OR_DEPARTMENT_OTHER)
Admission: EM | Admit: 2019-06-14 | Discharge: 2019-06-14 | Disposition: A | Discharge status: Home / Self Care | Payer: Self-pay | Attending: Emergency Medicine | Admitting: Emergency Medicine

## 2019-06-14 ENCOUNTER — Other Ambulatory Visit: Payer: Self-pay

## 2019-06-14 ENCOUNTER — Encounter (HOSPITAL_BASED_OUTPATIENT_CLINIC_OR_DEPARTMENT_OTHER): Payer: Self-pay | Admitting: Emergency Medicine

## 2019-06-14 DIAGNOSIS — F17210 Nicotine dependence, cigarettes, uncomplicated: Secondary | ICD-10-CM | POA: Insufficient documentation

## 2019-06-14 DIAGNOSIS — K029 Dental caries, unspecified: Secondary | ICD-10-CM | POA: Insufficient documentation

## 2019-06-14 MED ORDER — PENICILLIN V POTASSIUM 500 MG PO TABS: 500.0000 mg | ORAL_TABLET | Freq: Four times a day (QID) | ORAL | 0 refills | Status: AC

## 2019-06-14 MED ORDER — ACETAMINOPHEN 500 MG PO TABS
1000.0000 mg | ORAL_TABLET | Freq: Once | ORAL | Status: AC
  Administered 2019-06-14: 08:00:00 1000 mg via ORAL
  Filled 2019-06-14: qty 2

## 2019-06-14 MED ORDER — IBUPROFEN 800 MG PO TABS: 800.0000 mg | ORAL_TABLET | Freq: Three times a day (TID) | ORAL | 0 refills | Status: AC | PRN

## 2019-06-14 MED ORDER — PENICILLIN V POTASSIUM 250 MG PO TABS
500.0000 mg | ORAL_TABLET | Freq: Once | ORAL | Status: AC
  Administered 2019-06-14: 500 mg via ORAL
  Filled 2019-06-14: qty 2

## 2019-06-14 MED ORDER — ACETAMINOPHEN 500 MG PO TABS: 1000.0000 mg | ORAL_TABLET | Freq: Four times a day (QID) | ORAL | 0 refills | Status: DC | PRN

## 2019-06-14 NOTE — ED Triage Notes (Signed)
States," My face, gums and head started hurting last night " Last tool ibuprofen 600mg  PTA
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
## 2019-10-09 ENCOUNTER — Other Ambulatory Visit: Payer: Self-pay

## 2019-10-09 ENCOUNTER — Encounter (HOSPITAL_COMMUNITY): Payer: Self-pay

## 2019-10-09 ENCOUNTER — Emergency Department (HOSPITAL_COMMUNITY)
Admission: EM | Admit: 2019-10-09 | Discharge: 2019-10-09 | Disposition: A | Discharge status: Home / Self Care | Payer: Self-pay | Attending: Emergency Medicine | Admitting: Emergency Medicine

## 2019-10-09 DIAGNOSIS — S0990XA Unspecified injury of head, initial encounter: Secondary | ICD-10-CM

## 2019-10-09 DIAGNOSIS — S0181XA Laceration without foreign body of other part of head, initial encounter: Secondary | ICD-10-CM | POA: Insufficient documentation

## 2019-10-09 DIAGNOSIS — Y939 Activity, unspecified: Secondary | ICD-10-CM | POA: Insufficient documentation

## 2019-10-09 DIAGNOSIS — F17210 Nicotine dependence, cigarettes, uncomplicated: Secondary | ICD-10-CM | POA: Insufficient documentation

## 2019-10-09 DIAGNOSIS — Y929 Unspecified place or not applicable: Secondary | ICD-10-CM | POA: Insufficient documentation

## 2019-10-09 DIAGNOSIS — W19XXXA Unspecified fall, initial encounter: Secondary | ICD-10-CM | POA: Insufficient documentation

## 2019-10-09 DIAGNOSIS — Y999 Unspecified external cause status: Secondary | ICD-10-CM | POA: Insufficient documentation

## 2019-10-09 DIAGNOSIS — S61412A Laceration without foreign body of left hand, initial encounter: Secondary | ICD-10-CM | POA: Insufficient documentation

## 2019-10-09 MED ORDER — IBUPROFEN 800 MG PO TABS: 800.0000 mg | ORAL_TABLET | Freq: Three times a day (TID) | ORAL | 0 refills | Status: DC | PRN

## 2019-10-09 MED ORDER — IBUPROFEN 800 MG PO TABS
800.0000 mg | ORAL_TABLET | Freq: Once | ORAL | Status: AC
  Administered 2019-10-09: 800 mg via ORAL
  Filled 2019-10-09: qty 1

## 2019-10-09 NOTE — Discharge Instructions (Signed)
I have prescribed you ibuprofen for management of your pain.  Please take this as prescribed.  Please take with a small amount of food to prevent stomach irritation.  Please refer to the information I provided regarding wound care.  If your symptoms worsen or you develop any signs or symptoms of infection, you can return to the emergency department for reevaluation.  It was a pleasure to meet you.
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
## 2019-10-09 NOTE — ED Notes (Signed)
Dermabond provided to Portia PA
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
## 2019-12-23 ENCOUNTER — Encounter (HOSPITAL_BASED_OUTPATIENT_CLINIC_OR_DEPARTMENT_OTHER): Payer: Self-pay

## 2019-12-23 ENCOUNTER — Emergency Department (HOSPITAL_BASED_OUTPATIENT_CLINIC_OR_DEPARTMENT_OTHER): Payer: Self-pay

## 2019-12-23 ENCOUNTER — Other Ambulatory Visit: Payer: Self-pay

## 2019-12-23 ENCOUNTER — Emergency Department (HOSPITAL_BASED_OUTPATIENT_CLINIC_OR_DEPARTMENT_OTHER)
Admission: EM | Admit: 2019-12-23 | Discharge: 2019-12-23 | Disposition: A | Discharge status: Home / Self Care | Payer: Self-pay | Attending: Emergency Medicine | Admitting: Emergency Medicine

## 2019-12-23 DIAGNOSIS — S62346A Nondisplaced fracture of base of fifth metacarpal bone, right hand, initial encounter for closed fracture: Secondary | ICD-10-CM | POA: Insufficient documentation

## 2019-12-23 DIAGNOSIS — F17210 Nicotine dependence, cigarettes, uncomplicated: Secondary | ICD-10-CM | POA: Insufficient documentation

## 2019-12-23 DIAGNOSIS — W230XXA Caught, crushed, jammed, or pinched between moving objects, initial encounter: Secondary | ICD-10-CM | POA: Insufficient documentation

## 2019-12-23 MED ORDER — OXYCODONE-ACETAMINOPHEN 5-325 MG PO TABS
1.0000 | ORAL_TABLET | Freq: Once | ORAL | Status: AC
  Administered 2019-12-23: 1 via ORAL
  Filled 2019-12-23: qty 1

## 2019-12-23 MED ORDER — OXYCODONE-ACETAMINOPHEN 5-325 MG PO TABS: 1.0000 | ORAL_TABLET | Freq: Four times a day (QID) | ORAL | 0 refills | Status: DC | PRN

## 2019-12-23 MED FILL — OXYCODONE-ACETAMINOPHEN 5-3: 5-325 | 2 days supply | Qty: 15 | Fill #0
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
## 2019-12-23 NOTE — ED Triage Notes (Signed)
Pt reports right hand injury yesterday-swelling noted-NAD-steady gait

## 2019-12-23 NOTE — Discharge Instructions (Signed)
Please read instructions below. Keep the splint clean, dry and in place at all times. Elevate your hand as much as possible. You can take oxycodone every 6 hours as needed for severe pain. Be aware this medication can make you drowsy. Do not operate motor vehicles or drink alcohol while taking this medication. Take 600-800mg  of ibuprofen every 6 hours for pain and swelling. Call the orthopedic hand specialist office the next business day to schedule an appointment for repeat xray and follow up on your injury in about 1-2 weeks. Return to the ED for concerning symptoms.

## 2020-01-11 ENCOUNTER — Other Ambulatory Visit (HOSPITAL_BASED_OUTPATIENT_CLINIC_OR_DEPARTMENT_OTHER): Payer: Self-pay | Admitting: Emergency Medicine

## 2020-01-11 ENCOUNTER — Encounter (HOSPITAL_BASED_OUTPATIENT_CLINIC_OR_DEPARTMENT_OTHER): Payer: Self-pay | Admitting: *Deleted

## 2020-01-11 ENCOUNTER — Other Ambulatory Visit: Payer: Self-pay

## 2020-01-11 ENCOUNTER — Emergency Department (HOSPITAL_BASED_OUTPATIENT_CLINIC_OR_DEPARTMENT_OTHER)
Admission: EM | Admit: 2020-01-11 | Discharge: 2020-01-11 | Disposition: A | Discharge status: Home / Self Care | Payer: HRSA Program | Attending: Emergency Medicine | Admitting: Emergency Medicine

## 2020-01-11 ENCOUNTER — Emergency Department (HOSPITAL_BASED_OUTPATIENT_CLINIC_OR_DEPARTMENT_OTHER): Payer: HRSA Program

## 2020-01-11 DIAGNOSIS — J189 Pneumonia, unspecified organism: Secondary | ICD-10-CM

## 2020-01-11 DIAGNOSIS — R059 Cough, unspecified: Secondary | ICD-10-CM | POA: Diagnosis present

## 2020-01-11 DIAGNOSIS — F17210 Nicotine dependence, cigarettes, uncomplicated: Secondary | ICD-10-CM | POA: Diagnosis not present

## 2020-01-11 DIAGNOSIS — J181 Lobar pneumonia, unspecified organism: Secondary | ICD-10-CM | POA: Diagnosis not present

## 2020-01-11 DIAGNOSIS — U071 COVID-19: Secondary | ICD-10-CM | POA: Diagnosis not present

## 2020-01-11 LAB — RESPIRATORY PANEL BY RT PCR (FLU A&B, COVID)
Influenza A by PCR: NEGATIVE
Influenza B by PCR: NEGATIVE
SARS Coronavirus 2 by RT PCR: POSITIVE — AB

## 2020-01-11 MED ORDER — DOXYCYCLINE HYCLATE 100 MG PO CAPS: 100.0000 mg | ORAL_CAPSULE | Freq: Two times a day (BID) | ORAL | 0 refills | Status: DC

## 2020-01-11 MED ORDER — IBUPROFEN 800 MG PO TABS: 800.0000 mg | ORAL_TABLET | Freq: Three times a day (TID) | ORAL | 0 refills | Status: DC

## 2020-01-11 MED FILL — DOXYCYCLINE HYCLATE 100 MG: 100 | 10 days supply | Qty: 20 | Fill #0

## 2020-01-11 MED FILL — IBUPROFEN 800 MG TAB: 800 | 7 days supply | Qty: 21 | Fill #0
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
## 2020-01-11 NOTE — Discharge Instructions (Addendum)
Call the COVID clinic if you would like to schedule an antibody infusion.  This needs to be done in the first 10 days of illness.  Take doxycycline as prescribed and complete the full course.  The pneumonia on your chest x-ray may be from Covid in which case this is a viral pneumonia and antibiotic will not help.  It is important to take the antibiotic in case there is pneumonia is caused by bacteria. You should follow-up with your doctor for repeat chest x-ray in 6 weeks.  Home to quarantine, return to ER for worsening or concerning symptoms.

## 2020-01-11 NOTE — ED Triage Notes (Signed)
Cough and weakness and headache started 2 days ago.  Denies fever.

## 2020-01-12 ENCOUNTER — Telehealth: Payer: Self-pay | Admitting: Nurse Practitioner

## 2020-01-12 DIAGNOSIS — U071 COVID-19: Secondary | ICD-10-CM

## 2020-01-12 NOTE — Telephone Encounter (Signed)
Called to Discuss with patient about Covid symptoms and the use of regeneron, a monoclonal antibody infusion for those with mild to moderate Covid symptoms and at a high risk of hospitalization.     Pt is qualified for this infusion at the [facility] due to co-morbid conditions and/or a member of an at-risk group.     Unable to reach pt. No available vm. No mychart. Sent text message.    Consuello Masse, DNP, AGNP-C [phone] (Infusion Center Hotline)

## 2020-03-29 ENCOUNTER — Emergency Department (HOSPITAL_BASED_OUTPATIENT_CLINIC_OR_DEPARTMENT_OTHER)
Admission: EM | Admit: 2020-03-29 | Discharge: 2020-03-29 | Disposition: A | Discharge status: Home / Self Care | Payer: HRSA Program | Attending: Emergency Medicine | Admitting: Emergency Medicine

## 2020-03-29 ENCOUNTER — Other Ambulatory Visit: Payer: Self-pay

## 2020-03-29 ENCOUNTER — Encounter (HOSPITAL_BASED_OUTPATIENT_CLINIC_OR_DEPARTMENT_OTHER): Payer: Self-pay | Admitting: *Deleted

## 2020-03-29 ENCOUNTER — Emergency Department (HOSPITAL_BASED_OUTPATIENT_CLINIC_OR_DEPARTMENT_OTHER): Payer: HRSA Program

## 2020-03-29 DIAGNOSIS — J069 Acute upper respiratory infection, unspecified: Secondary | ICD-10-CM | POA: Insufficient documentation

## 2020-03-29 DIAGNOSIS — U071 COVID-19: Secondary | ICD-10-CM | POA: Diagnosis not present

## 2020-03-29 DIAGNOSIS — N390 Urinary tract infection, site not specified: Secondary | ICD-10-CM

## 2020-03-29 DIAGNOSIS — J40 Bronchitis, not specified as acute or chronic: Secondary | ICD-10-CM

## 2020-03-29 DIAGNOSIS — F17210 Nicotine dependence, cigarettes, uncomplicated: Secondary | ICD-10-CM | POA: Insufficient documentation

## 2020-03-29 DIAGNOSIS — R519 Headache, unspecified: Secondary | ICD-10-CM | POA: Diagnosis present

## 2020-03-29 DIAGNOSIS — Z20822 Contact with and (suspected) exposure to covid-19: Secondary | ICD-10-CM

## 2020-03-29 LAB — URINALYSIS, ROUTINE W REFLEX MICROSCOPIC
Bilirubin Urine: NEGATIVE
Glucose, UA: NEGATIVE mg/dL
Ketones, ur: NEGATIVE mg/dL
Nitrite: NEGATIVE
Protein, ur: NEGATIVE mg/dL
Specific Gravity, Urine: 1.03 (ref 1.005–1.030)
pH: 6 (ref 5.0–8.0)

## 2020-03-29 LAB — URINALYSIS, MICROSCOPIC (REFLEX)

## 2020-03-29 MED ORDER — CEPHALEXIN 250 MG PO CAPS: 500.0000 mg | ORAL_CAPSULE | Freq: Once | ORAL | Status: DC

## 2020-03-29 MED ORDER — DOXYCYCLINE HYCLATE 100 MG PO CAPS: 100.0000 mg | ORAL_CAPSULE | Freq: Two times a day (BID) | ORAL | 0 refills | Status: DC

## 2020-03-29 MED ORDER — HYDROCOD POLST-CPM POLST ER 10-8 MG/5ML PO SUER
5.0000 mL | Freq: Two times a day (BID) | ORAL | 0 refills | Status: DC | PRN
Start: 2020-03-29 — End: 2022-07-01

## 2020-03-29 MED ORDER — DOXYCYCLINE HYCLATE 100 MG PO TABS
100.0000 mg | ORAL_TABLET | Freq: Once | ORAL | Status: AC
  Administered 2020-03-29: 23:00:00 100 mg via ORAL
  Filled 2020-03-29: qty 1

## 2020-03-29 MED ORDER — ACETAMINOPHEN 325 MG PO TABS
650.0000 mg | ORAL_TABLET | Freq: Once | ORAL | Status: DC
  Filled 2020-03-29: qty 2

## 2020-03-29 MED ORDER — LEVOFLOXACIN 500 MG PO TABS: 500.0000 mg | ORAL_TABLET | Freq: Once | ORAL | Status: DC

## 2020-03-29 MED ORDER — IBUPROFEN 800 MG PO TABS
800.0000 mg | ORAL_TABLET | Freq: Once | ORAL | Status: AC
  Administered 2020-03-29: 22:00:00 800 mg via ORAL
  Filled 2020-03-29: qty 1
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
## 2020-03-29 NOTE — Discharge Instructions (Signed)
Take Tussionex as needed for cough  Here tested for Covid as well as flu and RSV.  Please stay home until the results come back.  If you have Covid again you will need to stay home for 10 days.  For the other viruses you need to be fever free for 48 hours before going back to work  See your doctor for follow-up  Stay hydrated.  Take Tylenol or Motrin for headaches  Take Tussionex for cough.  Return to ER if you have worse trouble breathing, cough, fever, vomiting.

## 2020-03-29 NOTE — ED Notes (Signed)
Patient was outside.  He has just returned.

## 2020-03-29 NOTE — ED Triage Notes (Signed)
C/o fever , cough , congestion , h/a x 2 dyas

## 2020-03-30 LAB — RESPIRATORY PANEL BY PCR

## 2020-03-30 LAB — SARS CORONAVIRUS 2 (TAT 6-24 HRS): SARS Coronavirus 2: NEGATIVE
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
## 2020-07-15 ENCOUNTER — Emergency Department (HOSPITAL_BASED_OUTPATIENT_CLINIC_OR_DEPARTMENT_OTHER)
Admission: EM | Admit: 2020-07-15 | Discharge: 2020-07-15 | Disposition: A | Discharge status: Home / Self Care | Payer: Self-pay | Attending: Emergency Medicine | Admitting: Emergency Medicine

## 2020-07-15 ENCOUNTER — Emergency Department (HOSPITAL_BASED_OUTPATIENT_CLINIC_OR_DEPARTMENT_OTHER): Payer: Self-pay

## 2020-07-15 ENCOUNTER — Encounter (HOSPITAL_BASED_OUTPATIENT_CLINIC_OR_DEPARTMENT_OTHER): Payer: Self-pay | Admitting: Emergency Medicine

## 2020-07-15 ENCOUNTER — Other Ambulatory Visit: Payer: Self-pay

## 2020-07-15 DIAGNOSIS — M542 Cervicalgia: Secondary | ICD-10-CM | POA: Insufficient documentation

## 2020-07-15 DIAGNOSIS — J069 Acute upper respiratory infection, unspecified: Secondary | ICD-10-CM

## 2020-07-15 DIAGNOSIS — H66002 Acute suppurative otitis media without spontaneous rupture of ear drum, left ear: Secondary | ICD-10-CM

## 2020-07-15 DIAGNOSIS — F17210 Nicotine dependence, cigarettes, uncomplicated: Secondary | ICD-10-CM | POA: Insufficient documentation

## 2020-07-15 DIAGNOSIS — H6692 Otitis media, unspecified, left ear: Secondary | ICD-10-CM | POA: Insufficient documentation

## 2020-07-15 MED ORDER — GUAIFENESIN-CODEINE 100-10 MG/5ML PO SOLN: 10.0000 mL | Freq: Four times a day (QID) | ORAL | 0 refills | Status: DC | PRN

## 2020-07-15 MED ORDER — AZITHROMYCIN 250 MG PO TABS
500.0000 mg | ORAL_TABLET | Freq: Once | ORAL | Status: AC
  Administered 2020-07-15: 500 mg via ORAL
  Filled 2020-07-15: qty 2

## 2020-07-15 MED ORDER — AZITHROMYCIN 250 MG PO TABS: 250.0000 mg | ORAL_TABLET | Freq: Every day | ORAL | 0 refills | Status: DC

## 2020-07-15 MED ORDER — GUAIFENESIN-CODEINE 100-10 MG/5ML PO SOLN
10.0000 mL | Freq: Once | ORAL | Status: AC
  Administered 2020-07-15: 10 mL via ORAL
  Filled 2020-07-15: qty 10
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
## 2020-07-15 NOTE — Discharge Instructions (Signed)
Begin taking Zithromax as prescribed.  Begin taking Robitussin with codeine as prescribed as needed for cough.  Take this medication primarily at night as it will make you drowsy.  Continue over-the-counter medications as needed for symptom relief.  Follow-up with primary doctor if not improving in the next few days, and return to the ER if symptoms significantly worsen or change.

## 2020-07-15 NOTE — ED Triage Notes (Signed)
Reports shortness of breath, left ear pain and left facial/neck swelling for the last several days. Denies fevers. Speaks in full sentences without respiratory difficulty.

## 2022-07-01 ENCOUNTER — Other Ambulatory Visit: Payer: Self-pay

## 2022-07-01 ENCOUNTER — Emergency Department (HOSPITAL_BASED_OUTPATIENT_CLINIC_OR_DEPARTMENT_OTHER): Payer: Self-pay

## 2022-07-01 ENCOUNTER — Emergency Department (HOSPITAL_BASED_OUTPATIENT_CLINIC_OR_DEPARTMENT_OTHER)
Admission: EM | Admit: 2022-07-01 | Discharge: 2022-07-01 | Disposition: A | Discharge status: Home / Self Care | Payer: Self-pay | Attending: Emergency Medicine | Admitting: Emergency Medicine

## 2022-07-01 ENCOUNTER — Encounter (HOSPITAL_BASED_OUTPATIENT_CLINIC_OR_DEPARTMENT_OTHER): Payer: Self-pay

## 2022-07-01 DIAGNOSIS — M791 Myalgia, unspecified site: Secondary | ICD-10-CM | POA: Insufficient documentation

## 2022-07-01 DIAGNOSIS — M79602 Pain in left arm: Secondary | ICD-10-CM | POA: Insufficient documentation

## 2022-07-01 DIAGNOSIS — Z1152 Encounter for screening for COVID-19: Secondary | ICD-10-CM | POA: Insufficient documentation

## 2022-07-01 DIAGNOSIS — J069 Acute upper respiratory infection, unspecified: Secondary | ICD-10-CM | POA: Insufficient documentation

## 2022-07-01 LAB — RESP PANEL BY RT-PCR (RSV, FLU A&B, COVID)  RVPGX2
Influenza A by PCR: NEGATIVE
Influenza B by PCR: NEGATIVE
Resp Syncytial Virus by PCR: NEGATIVE
SARS Coronavirus 2 by RT PCR: NEGATIVE

## 2022-07-01 MED ORDER — HYDROCODONE-ACETAMINOPHEN 7.5-325 MG/15ML PO SOLN: 15.0000 mL | Freq: Four times a day (QID) | ORAL | 0 refills | Status: DC | PRN

## 2022-07-01 MED ORDER — HYDROCODONE-ACETAMINOPHEN 7.5-325 MG/15ML PO SOLN
10.0000 mL | Freq: Once | ORAL | Status: AC
  Administered 2022-07-01: 10 mL via ORAL
  Filled 2022-07-01: qty 15

## 2022-07-01 NOTE — ED Notes (Signed)
Rt assessed in triage. BBS clear. SAT 96%. No obvious SOB noted. Stated he has had a productive cough for 2 days. Unsure of fever but sweaty. Rt to monitor as needed

## 2022-07-01 NOTE — ED Notes (Signed)
Pt transported to [facility]

## 2022-07-01 NOTE — Discharge Instructions (Signed)
Your work-up in the ER today was reassuring for acute findings. You were swabbed for COVID, flu, and RSV which were negative.  Your chest x-ray also did not show signs of pneumonia.  However, your symptoms are still likely related to an upper respiratory infection. As these are almost always viral in nature, no antibiotics are indicated. I recommend that you get plenty of rest and focus on symptomatic relief which includes Cepacol throat lozenges for sore throat, Mucinex D (orange box) which you can get from behind the counter at your local pharmacy for congestion, and tylenol/ibuprofen as needed for fevers and bodyaches. I have also given you a prescription for codeine cough syrup which is a cough suppressant medication for you to take as prescribed as needed for management of your symptoms.  Do not drive or operate heavy machinery while taking this medication as it can be sedating.  I also recommend:  Increased fluid intake. Sports drinks offer valuable electrolytes, sugars, and fluids.  Breathing heated mist or steam (vaporizer or shower).  Eating chicken soup or other clear broths, and maintaining good nutrition.   Increasing usage of your inhaler if you have asthma.  Return to work when your temperature has returned to normal.  Gargle warm salt water and spit it out for sore throat. Take benadryl or Zyrtec to decrease sinus secretions.  Follow Up: Follow up with your primary care doctor in 5-7 days for recheck of ongoing symptoms.  Return to emergency department for emergent changing or worsening of symptoms.

## 2022-07-01 NOTE — ED Provider Notes (Signed)
[facility] Provider Note   CSN: [id number] Arrival date & time: 07/01/22  1158     History  Chief Complaint  Patient presents with   Cough   Arm Pain    Dylan Brooks is a 42 y.o. male.  Patient with no pertinent past medical history presents today with complaints of cough, congestion, bodyaches, and left arm pain. He states that his left arm has been hurting around his bicep for the past week. It is tender to touch and to move his arm. Denies any recent travel or surgeries but does state that he had surgery for a broken wrist in 2020 that has been without complication since then. Also states that he has had cough and congestion for the past 3 days. Has been taking Mucinex with minimal relief. Denies fevers or chills. Denies chest pain or shortness of breath.    The history is provided by the patient. No language interpreter was used.  Cough Arm Pain       Home Medications Prior to Admission medications   Medication Sig Start Date End Date Taking? Authorizing Provider  azithromycin (ZITHROMAX) 250 MG tablet Take 1 tablet (250 mg total) by mouth daily. 07/15/20   Veryl Speak, MD  chlorpheniramine-HYDROcodone (TUSSIONEX PENNKINETIC ER) 10-8 MG/5ML SUER Take 5 mLs by mouth every 12 (twelve) hours as needed for cough. 03/29/20   Drenda Freeze, MD  doxycycline (VIBRAMYCIN) 100 MG capsule Take 1 capsule (100 mg total) by mouth 2 (two) times daily. One po bid x 7 days 03/29/20   Drenda Freeze, MD  guaiFENesin-codeine 100-10 MG/5ML syrup Take 10 mLs by mouth every 6 (six) hours as needed for cough. 07/15/20   Veryl Speak, MD      Allergies    Patient has no known allergies.    Review of Systems   Review of Systems  Respiratory:  Positive for cough.   All other systems reviewed and are negative.   Physical Exam Updated Vital Signs BP 124/80   Pulse 62   Temp 98.2 F (36.8 C) (Oral)   Resp 19   SpO2 94%  Physical  Exam Vitals and nursing note reviewed.  Constitutional:      General: He is not in acute distress.    Appearance: Normal appearance. He is normal weight. He is not ill-appearing, toxic-appearing or diaphoretic.  HENT:     Head: Normocephalic and atraumatic.  Cardiovascular:     Rate and Rhythm: Normal rate and regular rhythm.     Heart sounds: Normal heart sounds.  Pulmonary:     Effort: Pulmonary effort is normal. No respiratory distress.     Breath sounds: Normal breath sounds.  Abdominal:     General: Abdomen is flat.     Palpations: Abdomen is soft.  Musculoskeletal:        General: Normal range of motion.     Cervical back: Normal range of motion.     Comments: TTP of the left medial arm. No erythema, warmth, fluctuance, or induration. No palpable cord. Radial and ulnar pulses intact and 2+. ROM intact.   Skin:    General: Skin is warm and dry.  Neurological:     General: No focal deficit present.     Mental Status: He is alert.  Psychiatric:        Mood and Affect: Mood normal.        Behavior: Behavior normal.     ED Results / Procedures /  Treatments   Labs (all labs ordered are listed, but only abnormal results are displayed) Labs Reviewed  RESP PANEL BY RT-PCR (RSV, FLU A&B, COVID)  [id number]    EKG EKG Interpretation  Date/Time:  Sunday July 01 2022 12:07:16 EDT Ventricular Rate:  82 PR Interval:  152 QRS Duration: 88 QT Interval:  379 QTC Calculation: 443 R Axis:   64 Text Interpretation: Sinus rhythm Probable left atrial enlargement No significant change was found Confirmed by Ezequiel Essex [id number]) on 07/01/2022 12:33:12 PM  Radiology US Venous Img Upper Left (DVT Study)  Result Date: 07/01/2022 CLINICAL DATA:  Left upper arm pain, wrist surgery 2 years ago EXAM: LEFT UPPER EXTREMITY VENOUS DOPPLER ULTRASOUND TECHNIQUE: Gray-scale sonography with graded compression, as well as color Doppler and duplex ultrasound were performed to evaluate the upper  extremity deep venous system from the level of the subclavian vein and including the jugular, axillary, basilic, radial, ulnar and upper cephalic vein. Spectral Doppler was utilized to evaluate flow at rest and with distal augmentation maneuvers. COMPARISON:  None Available. FINDINGS: Contralateral Subclavian Vein: Respiratory phasicity is normal and symmetric with the symptomatic side. No evidence of thrombus. Normal compressibility. Internal Jugular Vein: No evidence of thrombus. Normal compressibility, respiratory phasicity and response to augmentation. Subclavian Vein: No evidence of thrombus. Normal compressibility, respiratory phasicity and response to augmentation. Axillary Vein: No evidence of thrombus. Normal compressibility, respiratory phasicity and response to augmentation. Cephalic Vein: No evidence of thrombus. Normal compressibility, respiratory phasicity and response to augmentation. Basilic Vein: No evidence of thrombus. Normal compressibility, respiratory phasicity and response to augmentation. Brachial Veins: No evidence of thrombus. Normal compressibility, respiratory phasicity and response to augmentation. Radial Veins: No evidence of thrombus. Normal compressibility, respiratory phasicity and response to augmentation. Ulnar Veins: No evidence of thrombus. Normal compressibility, respiratory phasicity and response to augmentation. Other Findings:  None visualized. IMPRESSION: 1. No evidence of deep venous thrombosis within the left upper extremity. Electronically Signed   By: Randa Ngo M.D.   On: 07/01/2022 14:39   DG Chest 2 View  Result Date: 07/01/2022 CLINICAL DATA:  Cough and congestion EXAM: CHEST - 2 VIEW COMPARISON:  07/15/2020 FINDINGS: Normal heart size and mediastinal contours. No acute infiltrate or edema. No effusion or pneumothorax. No acute osseous findings. IMPRESSION: Stable from prior.  No convincing pneumonia. Electronically Signed   By: Jorje Guild M.D.   On:  07/01/2022 12:58    Procedures Procedures    Medications Ordered in ED Medications  HYDROcodone-acetaminophen (HYCET) 7.5-325 mg/15 ml solution 10 mL (10 mLs Oral Given 07/01/22 1416)    ED Course/ Medical Decision Making/ A&P                             Medical Decision Making Amount and/or Complexity of Data Reviewed Radiology: ordered.  Risk Prescription drug management.   This patient is a 42 y.o. male who presents to the ED for concern of cough, congestion, and left arm pain this involves an extensive number of treatment options, and is a complaint that carries with it a high risk of complications and morbidity. The emergent differential diagnosis prior to evaluation includes, but is not limited to,  URI, pneumonia, ACS, DVT/PE . This is not an exhaustive differential.   Past Medical History / Co-morbidities / Social History: Hx left wrist surgery  Physical Exam: Physical exam performed. The pertinent findings include: no pertinent physical exam findings  Lab Tests: I ordered,  and personally interpreted labs.  The pertinent results include:  COVID, flu, and RSV negative   Imaging Studies: I ordered imaging studies including CXR. I independently visualized and interpreted imaging which showed NAD. I agree with the radiologist interpretation.   Cardiac Monitoring:  The patient was maintained on a cardiac monitor.  My attending physician Dr. Wyvonnia Dusky viewed and interpreted the cardiac monitored which showed an underlying rhythm of: sinus rhythm, unchanged from previous. I agree with this interpretation.   Medications: I ordered medication including hycet  for cough. Reevaluation of the patient after these medicines showed that the patient improved. I have reviewed the patients home medicines and have made adjustments as needed.   Disposition:  Patient presents today with complaints of cough, congestion, and left arm pain.  He is afebrile, nontoxic-appearing, and in no  acute distress with reassuring vital signs.  Physical exam reveals lung sounds clear to auscultation in all fields and no palpable abnormalities to the left upper arm, tenderness to palpation over the left bicep.  Suspect patient has muscle strain with confounding upper respiratory infection.  Will recommend Tylenol and ibuprofen as needed for arm pain and give cough syrup as needed for URI.  Over-the-counter recommendations given as well. Evaluation and diagnostic testing in the emergency department does not suggest an emergent condition requiring admission or immediate intervention beyond what has been performed at this time.  Plan for discharge with close PCP follow-up.  Patient is understanding and amenable with plan, educated on red flag symptoms that would prompt immediate return.  Patient discharged in stable condition.   Final Clinical Impression(s) / ED Diagnoses Final diagnoses:  Viral URI with cough  Left arm pain    Rx / DC Orders ED Discharge Orders          Ordered    HYDROcodone-acetaminophen (HYCET) 7.5-325 mg/15 ml solution  4 times daily PRN        07/01/22 1605          An After Visit Summary was printed and given to the patient.     Nestor Lewandowsky 07/01/22 1621    Ezequiel Essex, MD 07/01/22 [phone]

## 2022-07-01 NOTE — ED Triage Notes (Addendum)
Pt presents with complaints of cough, congestion and body aches x 3 days.  Chest pain. Taking OTC at home without relief. Coughing up thick mucous. Also has concern for right upper arm swelling. Hx of surgery to right wrist

## 2022-07-01 NOTE — ED Notes (Signed)
D/c paperwork reviewed with pt, including prescriptions and follow up care.  All questions and/or concerns addressed at time of d/c.  No further needs expressed. . Pt verbalized understanding, Ambulatory with family to ED exit, NAD.   

## 2022-07-27 ENCOUNTER — Other Ambulatory Visit (HOSPITAL_BASED_OUTPATIENT_CLINIC_OR_DEPARTMENT_OTHER): Payer: Self-pay

## 2022-11-25 ENCOUNTER — Encounter (HOSPITAL_BASED_OUTPATIENT_CLINIC_OR_DEPARTMENT_OTHER): Payer: Self-pay | Admitting: Emergency Medicine

## 2022-11-25 ENCOUNTER — Other Ambulatory Visit: Payer: Self-pay

## 2022-11-25 DIAGNOSIS — J3489 Other specified disorders of nose and nasal sinuses: Secondary | ICD-10-CM | POA: Diagnosis not present

## 2022-11-25 DIAGNOSIS — R0602 Shortness of breath: Secondary | ICD-10-CM | POA: Insufficient documentation

## 2022-11-25 DIAGNOSIS — R0789 Other chest pain: Secondary | ICD-10-CM | POA: Insufficient documentation

## 2022-11-25 DIAGNOSIS — R059 Cough, unspecified: Secondary | ICD-10-CM | POA: Insufficient documentation

## 2022-11-25 NOTE — ED Triage Notes (Signed)
Patient arrived via POV c/o chest pain, shortness of breath w/ cough. Patient states productive cough for months, chest pain and shortness of breath over last couple days. Patient had negative Covid test at home. Patient coughing in triage. Patient is AO x 4, VS w/ elevated BP, normal gait.

## 2022-11-26 ENCOUNTER — Other Ambulatory Visit (HOSPITAL_BASED_OUTPATIENT_CLINIC_OR_DEPARTMENT_OTHER): Payer: Self-pay

## 2022-11-26 ENCOUNTER — Emergency Department (HOSPITAL_BASED_OUTPATIENT_CLINIC_OR_DEPARTMENT_OTHER)
Admission: EM | Admit: 2022-11-26 | Discharge: 2022-11-26 | Disposition: A | Discharge status: Home / Self Care | Payer: 59 | Attending: Emergency Medicine | Admitting: Emergency Medicine

## 2022-11-26 ENCOUNTER — Emergency Department (HOSPITAL_BASED_OUTPATIENT_CLINIC_OR_DEPARTMENT_OTHER): Payer: 59

## 2022-11-26 DIAGNOSIS — J069 Acute upper respiratory infection, unspecified: Secondary | ICD-10-CM

## 2022-11-26 LAB — BASIC METABOLIC PANEL WITH GFR
Anion gap: 10 (ref 5–15)
BUN: 14 mg/dL (ref 6–20)
CO2: 24 mmol/L (ref 22–32)
Calcium: 8.4 mg/dL — ABNORMAL LOW (ref 8.9–10.3)
Chloride: 105 mmol/L (ref 98–111)
Creatinine, Ser: 1.25 mg/dL — ABNORMAL HIGH (ref 0.61–1.24)
GFR, Estimated: >60 mL/min (ref >60)
Glucose, Bld: 127 mg/dL — ABNORMAL HIGH (ref 70–99)
Potassium: 3.4 mmol/L — ABNORMAL LOW (ref 3.5–5.1)
Sodium: 139 mmol/L (ref 135–145)

## 2022-11-26 LAB — CBC
HCT: 39 % (ref 39.0–52.0)
Hemoglobin: 12.8 g/dL — ABNORMAL LOW (ref 13.0–17.0)
MCH: 26.7 pg (ref 26.0–34.0)
MCHC: 32.8 g/dL (ref 30.0–36.0)
MCV: 81.4 fL (ref 80.0–100.0)
Platelets: 318 10*3/uL (ref 150–400)
RBC: 4.79 MIL/uL (ref 4.22–5.81)
RDW: 14.6 % (ref 11.5–15.5)
WBC: 10.3 10*3/uL (ref 4.0–10.5)
nRBC: 0 % (ref 0.0–0.2)

## 2022-11-26 LAB — TROPONIN I (HIGH SENSITIVITY): Troponin I (High Sensitivity): 3 ng/L (ref <18)

## 2022-11-26 MED ORDER — PREDNISONE 10 MG PO TABS: 20.0000 mg | ORAL_TABLET | Freq: Two times a day (BID) | ORAL | 0 refills | Status: DC

## 2022-11-26 MED ORDER — DOXYCYCLINE HYCLATE 100 MG PO CAPS: 100.0000 mg | ORAL_CAPSULE | Freq: Two times a day (BID) | ORAL | 0 refills | Status: DC

## 2022-11-26 MED ORDER — DOXYCYCLINE HYCLATE 100 MG PO TABS
100.0000 mg | ORAL_TABLET | Freq: Once | ORAL | Status: AC
  Administered 2022-11-26: 100 mg via ORAL
  Filled 2022-11-26: qty 1

## 2022-11-26 MED ORDER — ALBUTEROL SULFATE HFA 108 (90 BASE) MCG/ACT IN AERS
2.0000 | INHALATION_SPRAY | Freq: Once | RESPIRATORY_TRACT | Status: AC
  Administered 2022-11-26: 2 via RESPIRATORY_TRACT
  Filled 2022-11-26: qty 6.7

## 2022-11-26 MED ORDER — PREDNISONE 20 MG PO TABS
40.0000 mg | ORAL_TABLET | Freq: Once | ORAL | Status: AC
  Administered 2022-11-26: 40 mg via ORAL
  Filled 2022-11-26: qty 2

## 2022-11-26 NOTE — Discharge Instructions (Addendum)
Begin taking doxycycline and prednisone as prescribed.  Use the albuterol inhaler, 2 puffs every 4 hours as needed.  Continue over-the-counter medications as needed for relief of symptoms.  Follow-up with primary doctor if not improving in the next week.  Follow-up with ear nose and throat.  The contact information for [facility] has been provided in this discharge summary for you to call and make these arrangements.

## 2022-11-26 NOTE — ED Provider Notes (Signed)
[facility] Provider Note   CSN: [id number] Arrival date & time: 11/25/22  2339     History  Chief Complaint  Patient presents with   Shortness of Breath   Chest Pain   Cough    Dylan Brooks is a 42 y.o. male.  Patient is a 42 year old male presenting with a several month history of cough, chest congestion, runny nose.  Cough has been productive of excessive quantities of mucus.  No fevers or chills, but does feel short of breath intermittently.  Patient was seen here and told to take over-the-counter medications, however this is not helping.  The history is provided by the patient.  Chest Pain Cough Associated symptoms: chest pain        Home Medications Prior to Admission medications   Medication Sig Start Date End Date Taking? Authorizing Provider  azithromycin (ZITHROMAX) 250 MG tablet Take 1 tablet (250 mg total) by mouth daily. 07/15/20   Geoffery Lyons, MD  doxycycline (VIBRAMYCIN) 100 MG capsule Take 1 capsule (100 mg total) by mouth 2 (two) times daily. One po bid x 7 days 03/29/20   Charlynne Pander, MD  HYDROcodone-acetaminophen (HYCET) 7.5-325 mg/15 ml solution Take 15 mLs by mouth 4 (four) times daily as needed for moderate pain. 07/01/22 07/01/23  Smoot, Shawn Route, PA-C      Allergies    Patient has no known allergies.    Review of Systems   Review of Systems  Cardiovascular:  Positive for chest pain.  All other systems reviewed and are negative.   Physical Exam Updated Vital Signs BP 107/65 (BP Location: Left Arm)   Pulse 69   Temp 98.7 F (37.1 C)   Resp (!) 22   Ht 5\' 11"  (1.803 m)   Wt 120.2 kg   SpO2 98%   BMI 36.96 kg/m  Physical Exam Vitals and nursing note reviewed.  Constitutional:      General: He is not in acute distress.    Appearance: He is well-developed. He is not diaphoretic.  HENT:     Head: Normocephalic and atraumatic.  Cardiovascular:     Rate and Rhythm: Normal rate and  regular rhythm.     Heart sounds: No murmur heard.    No friction rub.  Pulmonary:     Effort: Pulmonary effort is normal. No respiratory distress.     Breath sounds: Normal breath sounds. No wheezing or rales.  Abdominal:     General: Bowel sounds are normal. There is no distension.     Palpations: Abdomen is soft.     Tenderness: There is no abdominal tenderness.  Musculoskeletal:        General: Normal range of motion.     Cervical back: Normal range of motion and neck supple.  Skin:    General: Skin is warm and dry.  Neurological:     Mental Status: He is alert and oriented to person, place, and time.     Coordination: Coordination normal.     ED Results / Procedures / Treatments   Labs (all labs ordered are listed, but only abnormal results are displayed) Labs Reviewed  BASIC METABOLIC PANEL - Abnormal; Notable for the following components:      Result Value   Potassium 3.4 (*)    Glucose, Bld 127 (*)    Creatinine, Ser 1.25 (*)    Calcium 8.4 (*)    All other components within normal limits  CBC - Abnormal; Notable  for the following components:   Hemoglobin 12.8 (*)    All other components within normal limits  TROPONIN I (HIGH SENSITIVITY)    EKG EKG Interpretation Date/Time:  Sunday November 25 2022 23:59:51 EDT Ventricular Rate:  80 PR Interval:  144 QRS Duration:  86 QT Interval:  372 QTC Calculation: 429 R Axis:   53  Text Interpretation: Normal sinus rhythm Cannot rule out Anterior infarct , age undetermined Abnormal ECG When compared with ECG of 07/01/2022, no significant change is noted Confirmed by Geoffery Lyons ([id number]) on 11/26/2022 12:29:14 AM  Radiology DG Chest 2 View  Result Date: 11/26/2022 CLINICAL DATA:  Chest pain and shortness of breath. EXAM: CHEST - 2 VIEW COMPARISON:  July 01, 2022 FINDINGS: The heart size and mediastinal contours are within normal limits. Both lungs are clear. The visualized skeletal structures are unremarkable.  IMPRESSION: No active cardiopulmonary disease. Electronically Signed   By: Aram Candela M.D.   On: 11/26/2022 00:19    Procedures Procedures    Medications Ordered in ED Medications - No data to display  ED Course/ Medical Decision Making/ A&P  Patient presenting with URI symptoms and persistent productive cough for many weeks.  He took a negative COVID test at home.  Patient arrives here with stable vital signs and is afebrile.  Oxygen saturations are 98% and he is clinically well-appearing.  CBC, CMP, and troponin obtained and are all unremarkable.  Chest x-ray shows no acute cardiopulmonary disease.  As patient has had persistent URI symptoms for weeks, I will prescribe an antibiotic, steroid, and an albuterol inhaler.  Patient is also asked about referral to ENT as he has been having issues with his nose.  Final Clinical Impression(s) / ED Diagnoses Final diagnoses:  None    Rx / DC Orders ED Discharge Orders     None         Geoffery Lyons, MD 11/26/22 [phone]

## 2023-01-08 ENCOUNTER — Emergency Department (HOSPITAL_BASED_OUTPATIENT_CLINIC_OR_DEPARTMENT_OTHER): Payer: 59

## 2023-01-08 ENCOUNTER — Encounter (HOSPITAL_BASED_OUTPATIENT_CLINIC_OR_DEPARTMENT_OTHER): Payer: Self-pay

## 2023-01-08 ENCOUNTER — Emergency Department (HOSPITAL_BASED_OUTPATIENT_CLINIC_OR_DEPARTMENT_OTHER)
Admission: EM | Admit: 2023-01-08 | Discharge: 2023-01-09 | Disposition: A | Discharge status: Home / Self Care | Payer: 59 | Attending: Emergency Medicine | Admitting: Emergency Medicine

## 2023-01-08 ENCOUNTER — Other Ambulatory Visit: Payer: Self-pay

## 2023-01-08 DIAGNOSIS — R059 Cough, unspecified: Secondary | ICD-10-CM | POA: Insufficient documentation

## 2023-01-08 DIAGNOSIS — M25511 Pain in right shoulder: Secondary | ICD-10-CM | POA: Insufficient documentation

## 2023-01-08 DIAGNOSIS — R0981 Nasal congestion: Secondary | ICD-10-CM | POA: Diagnosis not present

## 2023-01-08 DIAGNOSIS — R202 Paresthesia of skin: Secondary | ICD-10-CM | POA: Diagnosis not present

## 2023-01-08 DIAGNOSIS — J3489 Other specified disorders of nose and nasal sinuses: Secondary | ICD-10-CM | POA: Insufficient documentation

## 2023-01-08 NOTE — ED Triage Notes (Signed)
Patient reports shoulder pain that radiates down his right arm and up his back. Patient states the pain started a week ago but has gotten worse over the last two days. Patient took ibuprofen with no relief. Pain is rated 9/10.

## 2023-01-09 MED ORDER — KETOROLAC TROMETHAMINE 60 MG/2ML IM SOLN
30.0000 mg | Freq: Once | INTRAMUSCULAR | Status: AC
  Administered 2023-01-09: 30 mg via INTRAMUSCULAR
  Filled 2023-01-09: qty 2

## 2023-01-09 MED ORDER — METHOCARBAMOL 500 MG PO TABS
500.0000 mg | ORAL_TABLET | Freq: Once | ORAL | Status: AC
  Administered 2023-01-09: 500 mg via ORAL
  Filled 2023-01-09: qty 1

## 2023-01-09 MED ORDER — NAPROXEN 500 MG PO TABS: 500.0000 mg | ORAL_TABLET | Freq: Two times a day (BID) | ORAL | 0 refills | Status: DC

## 2023-01-09 MED ORDER — METHOCARBAMOL 500 MG PO TABS: 500.0000 mg | ORAL_TABLET | Freq: Two times a day (BID) | ORAL | 0 refills | Status: DC

## 2023-01-09 NOTE — ED Provider Notes (Signed)
[facility]  Provider Note  CSN: [id number] Arrival date & time: 01/08/23 2034  History Chief Complaint  Patient presents with   Back Pain   Shoulder Pain    Dylan Brooks is a 42 y.o. male with no significant PMH reports 2 weeks of R shoulder pain, aching pain is persistent with occasional worsening spasms and tingling radiating down his R arm. He has tried taking OTC pain medications with some improvement. Has also had a runny nose and cough for several months, seen about a month ago and given Abx.    Home Medications Prior to Admission medications   Medication Sig Start Date End Date Taking? Authorizing Provider  methocarbamol (ROBAXIN) 500 MG tablet Take 1 tablet (500 mg total) by mouth 2 (two) times daily. 01/09/23  Yes Pollyann Savoy, MD  naproxen (NAPROSYN) 500 MG tablet Take 1 tablet (500 mg total) by mouth 2 (two) times daily. 01/09/23  Yes Pollyann Savoy, MD  azithromycin (ZITHROMAX) 250 MG tablet Take 1 tablet (250 mg total) by mouth daily. 07/15/20   Geoffery Lyons, MD  doxycycline (VIBRAMYCIN) 100 MG capsule Take 1 capsule (100 mg total) by mouth 2 (two) times daily. One po bid x 7 days 11/26/22   Geoffery Lyons, MD  HYDROcodone-acetaminophen (HYCET) 7.5-325 mg/15 ml solution Take 15 mLs by mouth 4 (four) times daily as needed for moderate pain. 07/01/22 07/01/23  Smoot, Shawn Route, PA-C  predniSONE (DELTASONE) 10 MG tablet Take 2 tablets (20 mg total) by mouth 2 (two) times daily. 11/26/22   Geoffery Lyons, MD     Allergies    Patient has no known allergies.   Review of Systems   Review of Systems Please see HPI for pertinent positives and negatives  Physical Exam BP 127/82 (BP Location: Left Arm)   Pulse 74   Temp 97.8 F (36.6 C)   Resp 18   Ht 5\' 11"  (1.803 m)   Wt 117.9 kg   SpO2 95%   BMI 36.26 kg/m   Physical Exam Vitals and nursing note reviewed.  Constitutional:      Appearance: Normal appearance.   HENT:     Head: Normocephalic and atraumatic.     Nose: Congestion and rhinorrhea present.     Mouth/Throat:     Mouth: Mucous membranes are moist.  Eyes:     Extraocular Movements: Extraocular movements intact.     Conjunctiva/sclera: Conjunctivae normal.  Cardiovascular:     Rate and Rhythm: Normal rate.  Pulmonary:     Effort: Pulmonary effort is normal.     Breath sounds: Normal breath sounds.  Abdominal:     General: Abdomen is flat.     Palpations: Abdomen is soft.     Tenderness: There is no abdominal tenderness.  Musculoskeletal:        General: Tenderness (R trapezius muscle) present. No swelling. Normal range of motion.     Cervical back: Neck supple.  Skin:    General: Skin is warm and dry.  Neurological:     General: No focal deficit present.     Mental Status: He is alert.  Psychiatric:        Mood and Affect: Mood normal.     ED Results / Procedures / Treatments   EKG None  Procedures Procedures  Medications Ordered in the ED Medications  ketorolac (TORADOL) injection 30 mg (30 mg Intramuscular Given 01/09/23 0009)  methocarbamol (ROBAXIN) tablet 500 mg (500 mg Oral  Given 01/09/23 0008)    Initial Impression and Plan  Patient here with MSK R shoulder pain. Also has persistent URI symptoms 'that never go away'. Vitals and exam are reassuring. I personally viewed the images from radiology studies and agree with radiologist interpretation: CXR and Shoulder xray are negative for acute process. Will give IM toradol and PO Robaxin for his pain  ED Course   Clinical Course as of 01/09/23 0135  Wed Jan 09, 2023  0133 Patient feeling better after meds. Sleeping soundly in no distress. Plan discharge with Rx for Naprosyn, Robaxin. Recommend rest, heat, PCP follow up, RTED for any other concerns.   [CS]    Clinical Course User Index [CS] Pollyann Savoy, MD     MDM Rules/Calculators/A&P Medical Decision Making Problems Addressed: Acute pain of right  shoulder: acute illness or injury  Amount and/or Complexity of Data Reviewed Radiology: ordered and independent interpretation performed. Decision-making details documented in ED Course.  Risk Prescription drug management.     Final Clinical Impression(s) / ED Diagnoses Final diagnoses:  Acute pain of right shoulder    Rx / DC Orders ED Discharge Orders          Ordered    naproxen (NAPROSYN) 500 MG tablet  2 times daily        01/09/23 0135    methocarbamol (ROBAXIN) 500 MG tablet  2 times daily        01/09/23 0135             Pollyann Savoy, MD 01/09/23 [phone]

## 2023-05-19 ENCOUNTER — Other Ambulatory Visit: Payer: Self-pay

## 2023-05-19 DIAGNOSIS — N3001 Acute cystitis with hematuria: Secondary | ICD-10-CM | POA: Insufficient documentation

## 2023-05-19 DIAGNOSIS — K0889 Other specified disorders of teeth and supporting structures: Secondary | ICD-10-CM | POA: Insufficient documentation

## 2023-05-19 DIAGNOSIS — K625 Hemorrhage of anus and rectum: Secondary | ICD-10-CM | POA: Insufficient documentation

## 2023-05-19 DIAGNOSIS — Z79899 Other long term (current) drug therapy: Secondary | ICD-10-CM | POA: Insufficient documentation

## 2023-05-20 ENCOUNTER — Emergency Department (HOSPITAL_BASED_OUTPATIENT_CLINIC_OR_DEPARTMENT_OTHER)
Admission: EM | Admit: 2023-05-20 | Discharge: 2023-05-20 | Disposition: A | Discharge status: Home / Self Care | Payer: Self-pay | Attending: Emergency Medicine | Admitting: Emergency Medicine

## 2023-05-20 ENCOUNTER — Encounter (HOSPITAL_BASED_OUTPATIENT_CLINIC_OR_DEPARTMENT_OTHER): Payer: Self-pay | Admitting: Emergency Medicine

## 2023-05-20 DIAGNOSIS — K0889 Other specified disorders of teeth and supporting structures: Secondary | ICD-10-CM

## 2023-05-20 DIAGNOSIS — N3001 Acute cystitis with hematuria: Secondary | ICD-10-CM

## 2023-05-20 DIAGNOSIS — K625 Hemorrhage of anus and rectum: Secondary | ICD-10-CM

## 2023-05-20 LAB — URINALYSIS, ROUTINE W REFLEX MICROSCOPIC
Bilirubin Urine: NEGATIVE
Glucose, UA: NEGATIVE mg/dL
Ketones, ur: NEGATIVE mg/dL
Nitrite: NEGATIVE
Protein, ur: NEGATIVE mg/dL
Specific Gravity, Urine: >=1.03 (ref 1.005–1.030)
pH: 5.5 (ref 5.0–8.0)

## 2023-05-20 LAB — CBC
HCT: 40 % (ref 39.0–52.0)
Hemoglobin: 12.8 g/dL — ABNORMAL LOW (ref 13.0–17.0)
MCH: 26.2 pg (ref 26.0–34.0)
MCHC: 32 g/dL (ref 30.0–36.0)
MCV: 81.8 fL (ref 80.0–100.0)
Platelets: 356 10*3/uL (ref 150–400)
RBC: 4.89 MIL/uL (ref 4.22–5.81)
RDW: 14.8 % (ref 11.5–15.5)
WBC: 10.5 10*3/uL (ref 4.0–10.5)
nRBC: 0 % (ref 0.0–0.2)

## 2023-05-20 LAB — URINALYSIS, MICROSCOPIC (REFLEX)

## 2023-05-20 LAB — COMPREHENSIVE METABOLIC PANEL
ALT: 19 U/L (ref 0–44)
AST: 18 U/L (ref 15–41)
Albumin: 3.3 g/dL — ABNORMAL LOW (ref 3.5–5.0)
Alkaline Phosphatase: 55 U/L (ref 38–126)
Anion gap: 9 (ref 5–15)
BUN: 12 mg/dL (ref 6–20)
CO2: 23 mmol/L (ref 22–32)
Calcium: 8.6 mg/dL — ABNORMAL LOW (ref 8.9–10.3)
Chloride: 106 mmol/L (ref 98–111)
Creatinine, Ser: 1.25 mg/dL — ABNORMAL HIGH (ref 0.61–1.24)
GFR, Estimated: >60 mL/min (ref >60)
Glucose, Bld: 138 mg/dL — ABNORMAL HIGH (ref 70–99)
Potassium: 3.4 mmol/L — ABNORMAL LOW (ref 3.5–5.1)
Sodium: 138 mmol/L (ref 135–145)
Total Bilirubin: 0.5 mg/dL (ref 0.0–1.2)
Total Protein: 6.9 g/dL (ref 6.5–8.1)

## 2023-05-20 MED ORDER — PENICILLIN V POTASSIUM 250 MG PO TABS
500.0000 mg | ORAL_TABLET | Freq: Once | ORAL | Status: AC
  Administered 2023-05-20: 500 mg via ORAL
  Filled 2023-05-20: qty 2

## 2023-05-20 MED ORDER — KETOROLAC TROMETHAMINE 60 MG/2ML IM SOLN
30.0000 mg | Freq: Once | INTRAMUSCULAR | Status: AC
  Administered 2023-05-20: 30 mg via INTRAMUSCULAR
  Filled 2023-05-20: qty 2

## 2023-05-20 MED ORDER — NAPROXEN 500 MG PO TABS: 500.0000 mg | ORAL_TABLET | Freq: Two times a day (BID) | ORAL | 0 refills | Status: AC

## 2023-05-20 MED ORDER — CEPHALEXIN 500 MG PO CAPS: 500.0000 mg | ORAL_CAPSULE | Freq: Four times a day (QID) | ORAL | 0 refills | Status: AC

## 2023-05-20 MED ORDER — OXYCODONE-ACETAMINOPHEN 5-325 MG PO TABS
1.0000 | ORAL_TABLET | ORAL | Status: DC | PRN
  Administered 2023-05-20: 1 via ORAL
  Filled 2023-05-20: qty 1

## 2023-05-20 MED ORDER — OXYCODONE-ACETAMINOPHEN 5-325 MG PO TABS
2.0000 | ORAL_TABLET | Freq: Once | ORAL | Status: AC
  Administered 2023-05-20: 2 via ORAL
  Filled 2023-05-20: qty 2

## 2023-05-20 MED ORDER — HYDROCORTISONE ACETATE 25 MG RE SUPP: 25.0000 mg | Freq: Two times a day (BID) | RECTAL | 0 refills | Status: AC

## 2023-05-20 NOTE — ED Provider Notes (Signed)
Dylan Brooks Provider Note   CSN: [id number] Arrival date & time: 05/19/23  2348     History  Chief Complaint  Patient presents with   Dental Pain   Rectal Bleeding   Nasal Congestion    Dylan Brooks is a 43 y.o. male.  43 year old male who presents ER today with multiple complaints.  Seems that the thing that really brought him in tonight was dental pain.  Patient has known history of dental issues and his right upper jaw is hurting radiate up to his head.  Patient states that no fevers.  No trismus.  Feels similar to previous dental issues.  On his second note patient states he has dysuria, increased frequency and urgency and some penile discharge.  No rashes.  Thirdly patient states he has some rectal bleeding.  Initially states it was just on the toilet paper but then he states that a feels of the whole bowl full of blood.  He states that he has had problems like this before with hemorrhoids.  Has never seen a GI doctor.  No other associated symptoms.   Dental Pain Rectal Bleeding      Home Medications Prior to Admission medications   Medication Sig Start Date End Date Taking? Authorizing Provider  cephALEXin (KEFLEX) 500 MG capsule Take 1 capsule (500 mg total) by mouth 4 (four) times daily. 05/20/23  Yes Casondra Gasca, Barbara Cower, MD  hydrocortisone (ANUSOL-HC) 25 MG suppository Place 1 suppository (25 mg total) rectally 2 (two) times daily. 05/20/23  Yes Amel Kitch, Barbara Cower, MD  naproxen (NAPROSYN) 500 MG tablet Take 1 tablet (500 mg total) by mouth 2 (two) times daily. 05/20/23  Yes Vianca Bracher, Barbara Cower, MD      Allergies    Patient has no known allergies.    Review of Systems   Review of Systems  Gastrointestinal:  Positive for hematochezia.    Physical Exam Updated Vital Signs BP (!) 143/92 (BP Location: Right Arm)   Pulse 80   Temp 97.7 F (36.5 C) (Oral)   Resp 16   Ht 5\' 11"  (1.803 m)   Wt 117.9 kg   SpO2 98%   BMI 36.26 kg/m   Physical Exam Vitals and nursing note reviewed.  Constitutional:      Appearance: He is well-developed.  HENT:     Head: Normocephalic and atraumatic.     Nose: No congestion or rhinorrhea.  Cardiovascular:     Rate and Rhythm: Normal rate.  Pulmonary:     Effort: Pulmonary effort is normal. No respiratory distress.  Abdominal:     General: There is no distension.  Genitourinary:    Comments: Chaperoned by nurse Gaspar Garbe -skin tags in 1 area that shows some recent bleeding but no active bleeding. Musculoskeletal:        General: Normal range of motion.     Cervical back: Normal range of motion.  Neurological:     Mental Status: He is alert.     ED Results / Procedures / Treatments   Labs (all labs ordered are listed, but only abnormal results are displayed) Labs Reviewed  COMPREHENSIVE METABOLIC PANEL - Abnormal; Notable for the following components:      Result Value   Potassium 3.4 (*)    Glucose, Bld 138 (*)    Creatinine, Ser 1.25 (*)    Calcium 8.6 (*)    Albumin 3.3 (*)    All other components within normal limits  CBC - Abnormal; Notable for the  following components:   Hemoglobin 12.8 (*)    All other components within normal limits  URINALYSIS, ROUTINE W REFLEX MICROSCOPIC - Abnormal; Notable for the following components:   Hgb urine dipstick MODERATE (*)    Leukocytes,Ua TRACE (*)    All other components within normal limits  URINALYSIS, MICROSCOPIC (REFLEX) - Abnormal; Notable for the following components:   Bacteria, UA RARE (*)    All other components within normal limits  POC OCCULT BLOOD, ED  GC/CHLAMYDIA PROBE AMP ([place]) NOT AT [facility]    EKG None  Radiology No results found.  Procedures Procedures    Medications Ordered in ED Medications  oxyCODONE-acetaminophen (PERCOCET/ROXICET) 5-325 MG per tablet 1 tablet (1 tablet Oral Given 05/20/23 0019)  penicillin v potassium (VEETID) tablet 500 mg (500 mg Oral Given 05/20/23 0428)  ketorolac  (TORADOL) injection 30 mg (30 mg Intramuscular Given 05/20/23 0431)  oxyCODONE-acetaminophen (PERCOCET/ROXICET) 5-325 MG per tablet 2 tablet (2 tablets Oral Given 05/20/23 0429)    ED Course/ Medical Decision Making/ A&P                                 Medical Decision Making Amount and/or Complexity of Data Reviewed Labs: ordered.  Risk Prescription drug management.   Dental pain-pain meds provided here will do antibiotics at home with dental follow-up Rectal bleeding-no obvious bleeding at this time.  Hemoglobin stable.  Will use hemorrhoid treatment.  GI follow-up. Dysuria-blood and bacteria in the urine consistent with likely infection.  Will treat for same.  Antibiotics to cover this should cover dental as well.  Final Clinical Impression(s) / ED Diagnoses Final diagnoses:  Acute cystitis with hematuria  Pain, dental  Rectal bleeding    Rx / DC Orders ED Discharge Orders          Ordered    hydrocortisone (ANUSOL-HC) 25 MG suppository  2 times daily        05/20/23 0443    cephALEXin (KEFLEX) 500 MG capsule  4 times daily        05/20/23 0443    naproxen (NAPROSYN) 500 MG tablet  2 times daily        05/20/23 0443    Ambulatory referral to Gastroenterology        05/20/23 0443              Shauntee Karp, Barbara Cower, MD 05/20/23 0500

## 2023-05-20 NOTE — ED Notes (Signed)
Lab called. U/A and GC/Chlamydia are now added on to urine collection per lab.

## 2023-05-20 NOTE — ED Triage Notes (Addendum)
Dental pain right upper and lower has damage. Off and on X 2 weeks. Also frequent urination with discharge, blood in stool with weakness.

## 2023-05-21 LAB — GC/CHLAMYDIA PROBE AMP (~~LOC~~) NOT AT ARMC
Chlamydia: NEGATIVE
Comment: NEGATIVE
Comment: NORMAL
Neisseria Gonorrhea: NEGATIVE

## 2023-07-10 ENCOUNTER — Encounter: Payer: Self-pay | Admitting: Emergency Medicine

## 2023-10-23 ENCOUNTER — Other Ambulatory Visit: Payer: Self-pay

## 2023-10-23 ENCOUNTER — Encounter (HOSPITAL_BASED_OUTPATIENT_CLINIC_OR_DEPARTMENT_OTHER): Payer: Self-pay

## 2023-10-23 ENCOUNTER — Emergency Department (HOSPITAL_BASED_OUTPATIENT_CLINIC_OR_DEPARTMENT_OTHER): Payer: Self-pay

## 2023-10-23 DIAGNOSIS — R059 Cough, unspecified: Secondary | ICD-10-CM | POA: Insufficient documentation

## 2023-10-23 DIAGNOSIS — R062 Wheezing: Secondary | ICD-10-CM | POA: Insufficient documentation

## 2023-10-23 NOTE — ED Triage Notes (Addendum)
 Pt states she started wheezing a week ago.  No hx of asthma C/o chest soreness from coughing pt has nasal congestion Lung sounds diminished bilaterally

## 2023-10-24 ENCOUNTER — Emergency Department (HOSPITAL_BASED_OUTPATIENT_CLINIC_OR_DEPARTMENT_OTHER)
Admission: EM | Admit: 2023-10-24 | Discharge: 2023-10-24 | Disposition: A | Discharge status: Home / Self Care | Payer: Self-pay | Attending: Emergency Medicine | Admitting: Emergency Medicine

## 2023-10-24 DIAGNOSIS — J189 Pneumonia, unspecified organism: Secondary | ICD-10-CM

## 2023-10-24 MED ORDER — AMOXICILLIN-POT CLAVULANATE 875-125 MG PO TABS: 1.0000 | ORAL_TABLET | Freq: Two times a day (BID) | ORAL | 0 refills | Status: AC

## 2023-10-24 MED ORDER — AMOXICILLIN-POT CLAVULANATE 875-125 MG PO TABS
1.0000 | ORAL_TABLET | Freq: Once | ORAL | Status: AC
Start: 2023-10-24 — End: 2023-10-24
  Administered 2023-10-24: 1 via ORAL
  Filled 2023-10-24: qty 1

## 2023-10-24 NOTE — ED Provider Notes (Signed)
 [place] [facility] Provider Note   CSN: [id number] Arrival date & time: 10/23/23  2323     Patient presents with: Wheezing   Dylan Brooks is a 43 y.o. male.   43 year old male presents with concern for cough which is productive with thick green sputum as well as noisy lung sounds.  Symptoms started a little over a week ago.  He had some leftover doxycycline  so he took 100 mg twice a day for 7 days although missed dose over the weekend.  He has no fevers.  He is a daily smoker.  No other complaints or concerns.  No history of asthma.       Prior to Admission medications   Medication Sig Start Date End Date Taking? Authorizing Provider  cephALEXin  (KEFLEX ) 500 MG capsule Take 1 capsule (500 mg total) by mouth 4 (four) times daily. 05/20/23   Mesner, Selinda, MD  hydrocortisone  (ANUSOL -HC) 25 MG suppository Place 1 suppository (25 mg total) rectally 2 (two) times daily. 05/20/23   Mesner, Selinda, MD  naproxen  (NAPROSYN ) 500 MG tablet Take 1 tablet (500 mg total) by mouth 2 (two) times daily. 05/20/23   Mesner, Selinda, MD    Allergies: Patient has no known allergies.    Review of Systems Negative except as per HPI Updated Vital Signs BP 109/85 (BP Location: Right Arm)   Pulse 82   Temp 98.5 F (36.9 C)   Resp 18   Ht 5' 11 (1.803 m)   Wt 113.4 kg   SpO2 96%   BMI 34.87 kg/m   Physical Exam Vitals and nursing note reviewed.  Constitutional:      General: He is not in acute distress.    Appearance: He is well-developed. He is not diaphoretic.  HENT:     Head: Normocephalic and atraumatic.  Cardiovascular:     Rate and Rhythm: Normal rate and regular rhythm.  Pulmonary:     Effort: Pulmonary effort is normal.     Breath sounds: Wheezing and rhonchi present.  Skin:    General: Skin is warm and dry.     Findings: No erythema or rash.  Neurological:     Mental Status: He is alert and oriented to person, place, and time.  Psychiatric:         Behavior: Behavior normal.     (all labs ordered are listed, but only abnormal results are displayed) Labs Reviewed - No data to display  EKG: None  Radiology: DG Chest 2 View Result Date: 10/23/2023 CLINICAL DATA:  Wheezing EXAM: CHEST - 2 VIEW COMPARISON:  01/08/2023 FINDINGS: Suspicion of small patchy left lower lung infiltrate. Normal cardiac size. No pleural effusion or pneumothorax IMPRESSION: Suspicion of small patchy left lower lung infiltrate. Electronically Signed   By: Luke Bun M.D.   On: 10/23/2023 23:59     Procedures   Medications Ordered in the ED - No data to display                                  Medical Decision Making Amount and/or Complexity of Data Reviewed Radiology: ordered.   43 year old male presents with concern for cough as above.  Not improved Doxycycline  and Mucinex .  No fevers.  Chest x-ray is ordered her myself is concerning for suspicious left lower lobe infiltrate.  Will provide course of Augmentin  although recommend close follow-up with primary care provider and strict return  to ER precautions.  Encourage cessation of tobacco use.  Recommend repeat chest x-ray in 6 weeks.     Final diagnoses:  None    ED Discharge Orders     None          Dylan Brooks 10/24/23 0112    Griselda Norris, MD 10/24/23 0130

## 2023-10-24 NOTE — Discharge Instructions (Signed)
 Take Augmentin  as prescribed and complete the full course. Recheck with your primary care provider in 1 week. You should have a repeat chest x-ray in 6 weeks. Return to ER for worsening or concerning symptoms.

## 2023-12-02 ENCOUNTER — Emergency Department (HOSPITAL_BASED_OUTPATIENT_CLINIC_OR_DEPARTMENT_OTHER)

## 2023-12-02 ENCOUNTER — Other Ambulatory Visit: Payer: Self-pay

## 2023-12-02 ENCOUNTER — Encounter (HOSPITAL_BASED_OUTPATIENT_CLINIC_OR_DEPARTMENT_OTHER): Payer: Self-pay | Admitting: Urology

## 2023-12-02 ENCOUNTER — Emergency Department (HOSPITAL_BASED_OUTPATIENT_CLINIC_OR_DEPARTMENT_OTHER): Admission: EM | Admit: 2023-12-02 | Discharge: 2023-12-02 | Disposition: A | Discharge status: Home / Self Care

## 2023-12-02 DIAGNOSIS — Z7951 Long term (current) use of inhaled steroids: Secondary | ICD-10-CM | POA: Insufficient documentation

## 2023-12-02 DIAGNOSIS — R059 Cough, unspecified: Secondary | ICD-10-CM | POA: Diagnosis present

## 2023-12-02 DIAGNOSIS — J40 Bronchitis, not specified as acute or chronic: Secondary | ICD-10-CM | POA: Diagnosis not present

## 2023-12-02 DIAGNOSIS — Z87891 Personal history of nicotine dependence: Secondary | ICD-10-CM | POA: Diagnosis not present

## 2023-12-02 DIAGNOSIS — Z7952 Long term (current) use of systemic steroids: Secondary | ICD-10-CM | POA: Insufficient documentation

## 2023-12-02 LAB — RESP PANEL BY RT-PCR (RSV, FLU A&B, COVID)  RVPGX2
Influenza A by PCR: NEGATIVE
Influenza B by PCR: NEGATIVE
Resp Syncytial Virus by PCR: NEGATIVE
SARS Coronavirus 2 by RT PCR: NEGATIVE

## 2023-12-02 MED ORDER — PREDNISONE 50 MG PO TABS
60.0000 mg | ORAL_TABLET | Freq: Once | ORAL | Status: AC
  Administered 2023-12-02: 60 mg via ORAL
  Filled 2023-12-02: qty 1

## 2023-12-02 MED ORDER — ALBUTEROL SULFATE HFA 108 (90 BASE) MCG/ACT IN AERS: 2.0000 | INHALATION_SPRAY | RESPIRATORY_TRACT | 0 refills | Status: AC | PRN

## 2023-12-02 MED ORDER — IPRATROPIUM-ALBUTEROL 0.5-2.5 (3) MG/3ML IN SOLN
3.0000 mL | Freq: Once | RESPIRATORY_TRACT | Status: AC
  Administered 2023-12-02: 3 mL via RESPIRATORY_TRACT
  Filled 2023-12-02: qty 3

## 2023-12-02 MED ORDER — PREDNISONE 10 MG (21) PO TBPK: ORAL_TABLET | Freq: Every day | ORAL | 0 refills | Status: AC

## 2023-12-02 MED ORDER — AZITHROMYCIN 250 MG PO TABS: 250.0000 mg | ORAL_TABLET | Freq: Every day | ORAL | 0 refills | Status: AC

## 2023-12-02 NOTE — ED Triage Notes (Signed)
 Pt states shortness of breath, productive cough, and wheezing that started last night  Denies fever   Pneumonia 6 weeks ago

## 2023-12-02 NOTE — Discharge Instructions (Addendum)
 Please follow-up with your primary doctor.  Return develop fevers, chills, worsening shortness of breath, lightheadedness, passout or any new or worsening symptoms that are concerning to you.

## 2023-12-02 NOTE — ED Provider Notes (Signed)
 [place] [facility] Provider Note   CSN: [id number] Arrival date & time: 12/02/23  2051     Patient presents with: Cough and Shortness of Breath   Jaiquan Temme is a 43 y.o. male.   Is a 43 year old male presenting emergency department with complaint of cough and shortness of breath.  Symptoms started last night.  Notes some rhinorrhea and congestion yesterday and then last night and some wheezing and productive cough.  Tried a friend's albuterol  inhaler with little improvement.  Denies history of lung problems, but does have close to a 30-pack-year history of smoking.  No nausea no vomiting.  No fevers.   Cough Associated symptoms: shortness of breath   Shortness of Breath Associated symptoms: cough        Prior to Admission medications   Medication Sig Start Date End Date Taking? Authorizing Provider  albuterol  (VENTOLIN  HFA) 108 (90 Base) MCG/ACT inhaler Inhale 2 puffs into the lungs every 4 (four) hours as needed for wheezing or shortness of breath. 12/02/23  Yes Neysa Caron PARAS, DO  azithromycin  (ZITHROMAX  Z-PAK) 250 MG tablet Take 1 tablet (250 mg total) by mouth daily. 12/02/23  Yes Muriel Hannold, Caron PARAS, DO  predniSONE  (STERAPRED UNI-PAK 21 TAB) 10 MG (21) TBPK tablet Take by mouth daily. Take 6 tabs by mouth daily  for 2 days, then 5 tabs for 2 days, then 4 tabs for 2 days, then 3 tabs for 2 days, 2 tabs for 2 days, then 1 tab by mouth daily for 2 days 12/02/23  Yes Esther Bradstreet, Caron PARAS, DO  cephALEXin  (KEFLEX ) 500 MG capsule Take 1 capsule (500 mg total) by mouth 4 (four) times daily. 05/20/23   Mesner, Selinda, MD  hydrocortisone  (ANUSOL -HC) 25 MG suppository Place 1 suppository (25 mg total) rectally 2 (two) times daily. 05/20/23   Mesner, Selinda, MD  naproxen  (NAPROSYN ) 500 MG tablet Take 1 tablet (500 mg total) by mouth 2 (two) times daily. 05/20/23   Mesner, Selinda, MD    Allergies: Patient has no known allergies.    Review of Systems  Respiratory:   Positive for cough and shortness of breath.     Updated Vital Signs BP 134/85   Pulse 71   Temp 98.1 F (36.7 C) (Oral)   Resp 17   Ht 5' 11 (1.803 m)   Wt 113.4 kg   SpO2 97%   BMI 34.87 kg/m   Physical Exam Vitals and nursing note reviewed.  Constitutional:      General: He is not in acute distress.    Appearance: He is not ill-appearing or toxic-appearing.  Cardiovascular:     Rate and Rhythm: Normal rate and regular rhythm.  Pulmonary:     Effort: Tachypnea present.     Breath sounds: Wheezing present.  Musculoskeletal:     Right lower leg: No edema.     Left lower leg: No edema.  Skin:    General: Skin is warm.     Capillary Refill: Capillary refill takes less than 2 seconds.  Neurological:     Mental Status: He is alert and oriented to person, place, and time.  Psychiatric:        Mood and Affect: Mood normal.        Behavior: Behavior normal.     (all labs ordered are listed, but only abnormal results are displayed) Labs Reviewed  RESP PANEL BY RT-PCR (RSV, FLU A&B, COVID)  [id number]    EKG: EKG Interpretation Date/Time:  Monday December 02 2023 21:15:26 EDT Ventricular Rate:  74 PR Interval:  149 QRS Duration:  90 QT Interval:  393 QTC Calculation: 436 R Axis:   59  Text Interpretation: Sinus rhythm Probable left atrial enlargement Confirmed by Neysa Clap [phone]) on 12/02/2023 9:26:52 PM  Radiology: [facility] Chest 2 View Result Date: 12/02/2023 CLINICAL DATA:  Cough and shortness of breath. EXAM: CHEST - 2 VIEW COMPARISON:  10/24/2023 FINDINGS: The cardiomediastinal contours are normal. Improved left lung base opacity from prior. Pulmonary vasculature is normal. No acute consolidation, pleural effusion, or pneumothorax. No acute osseous abnormalities are seen. IMPRESSION: Improved left lung base opacity from prior. No acute findings. Electronically Signed   By: Andrea Gasman M.D.   On: 12/02/2023 21:54     Procedures   Medications Ordered in the ED   ipratropium-albuterol  (DUONEB) 0.5-2.5 (3) MG/3ML nebulizer solution 3 mL (3 mLs Nebulization Given 12/02/23 2117)  predniSONE  (DELTASONE ) tablet 60 mg (60 mg Oral Given 12/02/23 2221)  ipratropium-albuterol  (DUONEB) 0.5-2.5 (3) MG/3ML nebulizer solution 3 mL (3 mLs Nebulization Given 12/02/23 2225)    Clinical Course as of 12/02/23 2327  Mon Dec 02, 2023  2208 DG Chest 2 View IMPRESSION: Improved left lung base opacity from prior. No acute findings.   Electronically Signed   By: Andrea Gasman M.D.   On: 12/02/2023 21:54   [TY]    Clinical Course User Index [TY] Neysa Clap PARAS, DO                                 Medical Decision Making Is a 43 year old male presenting emergency department with URI symptoms and cough.  He is afebrile nontachycardic, normotensive.  Maintaining oxygen saturation on room air.  Does not appear to be in respiratory distress.  Does have some minor wheezing on exam.  No documented history of COPD or asthma.  Does have a 30+ pack year history of smoking.  Last hospital interaction did have pneumonia several months ago.  X-ray today with resolved pneumonia.  And I do not appreciate other obvious abnormalities on my independent review of images flu/COVID/RSV ordered given the constellation of symptoms.  However negative.  EKG without arrhythmia or ST segment changes to indicate ischemia.  Was treated with DuoNebs with improvement of his symptoms.  Bronchitis versus underlying COPD.  Will discharge with steroids.  Amount and/or Complexity of Data Reviewed Independent Historian:     Details: Notes wheezing at home Radiology: ordered. Decision-making details documented in ED Course. ECG/medicine tests: ordered.  Risk Prescription drug management.       Final diagnoses:  Bronchitis    ED Discharge Orders          Ordered    azithromycin  (ZITHROMAX  Z-PAK) 250 MG tablet  Daily        12/02/23 2252    predniSONE  (STERAPRED UNI-PAK 21 TAB) 10 MG (21)  TBPK tablet  Daily        12/02/23 2252    albuterol  (VENTOLIN  HFA) 108 (90 Base) MCG/ACT inhaler  Every 4 hours PRN        12/02/23 2253               Neysa Clap PARAS, DO 12/02/23 2327
